# Patient Record
Sex: Male | Born: 1970 | Marital: Married | Smoking: Never smoker
Health system: Southern US, Community
[De-identification: ages and names within clinical notes are randomized; demographics above are authoritative.]

---

## 2016-07-16 HISTORY — PX: EYE SURGERY: SHX253

## 2019-01-20 ENCOUNTER — Emergency Department (HOSPITAL_COMMUNITY)
Admission: EM | Admit: 2019-01-20 | Discharge: 2019-01-20 | Disposition: A | Discharge status: Home / Self Care | Payer: Commercial Managed Care - PPO | Attending: Emergency Medicine | Admitting: Emergency Medicine

## 2019-01-20 ENCOUNTER — Other Ambulatory Visit: Payer: Self-pay

## 2019-01-20 ENCOUNTER — Encounter (HOSPITAL_COMMUNITY): Payer: Self-pay | Admitting: Emergency Medicine

## 2019-01-20 ENCOUNTER — Emergency Department (HOSPITAL_COMMUNITY): Payer: Commercial Managed Care - PPO

## 2019-01-20 DIAGNOSIS — Z8619 Personal history of other infectious and parasitic diseases: Secondary | ICD-10-CM

## 2019-01-20 DIAGNOSIS — J189 Pneumonia, unspecified organism: Secondary | ICD-10-CM

## 2019-01-20 DIAGNOSIS — R05 Cough: Secondary | ICD-10-CM | POA: Diagnosis present

## 2019-01-20 DIAGNOSIS — J181 Lobar pneumonia, unspecified organism: Secondary | ICD-10-CM | POA: Insufficient documentation

## 2019-01-20 DIAGNOSIS — Z8616 Personal history of COVID-19: Secondary | ICD-10-CM

## 2019-01-20 MED ORDER — BENZONATATE 100 MG PO CAPS
100.0000 mg | ORAL_CAPSULE | Freq: Once | ORAL | Status: AC
  Administered 2019-01-20: 100 mg via ORAL
  Filled 2019-01-20: qty 1

## 2019-01-20 MED ORDER — BENZONATATE 100 MG PO CAPS: 100.0000 mg | ORAL_CAPSULE | Freq: Three times a day (TID) | ORAL | 0 refills | Status: DC

## 2019-01-20 MED ORDER — ACETAMINOPHEN 325 MG PO TABS
650.0000 mg | ORAL_TABLET | Freq: Once | ORAL | Status: AC
  Administered 2019-01-20: 650 mg via ORAL
  Filled 2019-01-20: qty 2

## 2019-01-20 MED ORDER — ALBUTEROL SULFATE HFA 108 (90 BASE) MCG/ACT IN AERS
6.0000 | INHALATION_SPRAY | Freq: Once | RESPIRATORY_TRACT | Status: AC
  Administered 2019-01-20: 6 via RESPIRATORY_TRACT
  Filled 2019-01-20: qty 6.7

## 2019-01-20 MED ORDER — DOXYCYCLINE HYCLATE 100 MG PO CAPS: 100.0000 mg | ORAL_CAPSULE | Freq: Two times a day (BID) | ORAL | 0 refills | Status: DC

## 2019-01-20 NOTE — ED Notes (Signed)
Discharge paperwork reviewed with pt, including prescriptions.  Pt verbalized understanding, NAD.  Pt wheeled to ED entrance to await ride, left >6 feet away from other people in lobby and entryway.

## 2019-01-20 NOTE — ED Provider Notes (Signed)
[facility]-EMERGENCY DEPT Provider Note   CSN: [id number] Arrival date & time: 01/20/19  1331    History   Chief Complaint No chief complaint on file.   HPI Randall Gill is a 48 y.o. male.     HPI  Pt is a 48 y/o male who presents to the ED today for eval of cough. States he was diagnosed with COVID 2 weeks ago and since then has had a productive cough, fevers, generalized weakness, sore throat and shortness of breath. States that his shortness of breath is not present at rest but when he tried to move and coughs it makes him sob. Has cp only with cough.  He has had decreased PO intake secondary to his sxs. He had diarrhea 2 days ago that is since resolved. Denies abd pain, nausea or vomiting.  He has been using his daughter's nebulizer treatments at home which she states is been helpful.  History reviewed. No pertinent past medical history.  There are no active problems to display for this patient.   Past Surgical History:  Procedure Laterality Date  . EYE SURGERY  2018   lasix      Home Medications    Prior to Admission medications   Medication Sig Start Date End Date Taking? Authorizing Provider  acetaminophen (TYLENOL) 500 MG tablet Take 1,000 mg by mouth every 6 (six) hours as needed for mild pain.   Yes [provider]  benzonatate (TESSALON) 100 MG capsule Take 1 capsule (100 mg total) by mouth every 8 (eight) hours for 5 days. 01/20/19 01/25/19  Demarius Archila S, PA-C  doxycycline (VIBRAMYCIN) 100 MG capsule Take 1 capsule (100 mg total) by mouth 2 (two) times daily for 7 days. 01/20/19 01/27/19  Viha Kriegel S, PA-C    Family History No family history on file.  Social History Social History   Tobacco Use  . Smoking status: Never Smoker  Substance Use Topics  . Alcohol use: Yes    Comment: socially  . Drug use: Not on file     Allergies   Patient has no known allergies.   Review of Systems Review of Systems   Constitutional: Positive for appetite change, chills, diaphoresis and fever.  HENT: Positive for sore throat. Negative for congestion.   Eyes: Negative for visual disturbance.  Respiratory: Positive for cough and shortness of breath.   Cardiovascular: Positive for chest pain (with cough only).  Gastrointestinal: Positive for diarrhea. Negative for abdominal pain, constipation, nausea and vomiting.  Genitourinary: Negative for dysuria.  Musculoskeletal: Negative for back pain.  Neurological: Positive for headaches.     Physical Exam Updated Vital Signs BP 125/88   Pulse 86   Temp (!) 100.7 F (38.2 C) (Oral)   Resp (!) 28   Ht 5\' 4"  (1.626 m)   Wt 88.5 kg   SpO2 96%   BMI 33.47 kg/m   Physical Exam Vitals signs and nursing note reviewed.  Constitutional:      General: He is not in acute distress.    Appearance: He is well-developed. He is not ill-appearing or toxic-appearing.  HENT:     Head: Normocephalic and atraumatic.  Eyes:     Conjunctiva/sclera: Conjunctivae normal.  Neck:     Musculoskeletal: Neck supple.  Cardiovascular:     Rate and Rhythm: Normal rate and regular rhythm.     Pulses: Normal pulses.     Heart sounds: Normal heart sounds. No murmur.  Pulmonary:     Effort:  Pulmonary effort is normal. No respiratory distress.     Breath sounds: Normal breath sounds. No wheezing, rhonchi or rales.  Abdominal:     General: Bowel sounds are normal.     Palpations: Abdomen is soft.     Tenderness: There is no abdominal tenderness. There is no guarding or rebound.  Musculoskeletal:        General: No tenderness.     Right lower leg: No edema.     Left lower leg: No edema.  Skin:    General: Skin is warm and dry.  Neurological:     Mental Status: He is alert.      ED Treatments / Results  Labs (all labs ordered are listed, but only abnormal results are displayed) Labs Reviewed - No data to display  EKG None  Radiology Dg Chest Portable 1 View   Result Date: 01/20/2019 CLINICAL DATA:  Shortness of breath EXAM: PORTABLE CHEST 1 VIEW COMPARISON:  May 20, 2015 FINDINGS: There is subtle ill-defined opacity in the left lower lobe. Lungs elsewhere are clear. Heart is upper normal in size with pulmonary vascularity normal. No adenopathy. No bone lesions. IMPRESSION: Subtle ill-defined opacity left lower lobe, concerning for early pneumonia. Lungs elsewhere clear. Heart upper normal in size. No evident adenopathy. Electronically Signed   By: Lowella Grip III M.D.   On: 01/20/2019 15:27    Procedures Procedures (including critical care time)  Medications Ordered in ED Medications  acetaminophen (TYLENOL) tablet 650 mg (650 mg Oral Given 01/20/19 1411)  albuterol (VENTOLIN HFA) 108 (90 Base) MCG/ACT inhaler 6 puff (6 puffs Inhalation Given 01/20/19 1505)  benzonatate (TESSALON) capsule 100 mg (100 mg Oral Given 01/20/19 1504)     Initial Impression / Assessment and Plan / ED Course  I have reviewed the triage vital signs and the nursing notes.  Pertinent labs & imaging results that were available during my care of the patient were reviewed by me and considered in my medical decision making (see chart for details).   Final Clinical Impressions(s) / ED Diagnoses   Final diagnoses:  Pneumonia of left lower lobe due to infectious organism [facility])  History of 2019 novel coronavirus disease (COVID-5)   48 year old male presenting for evaluation of cough.  Recently diagnosed with coronavirus 2 weeks ago.  Has had some dyspnea when talking and ambulating but not at rest.  No chest pain.  Has had continued fevers.  On arrival he is febrile, mildly tachypneic, but otherwise his vital signs are reassuring.  He is satting well on room air.  He is in no acute distress on exam.  Nontoxic nonseptic appearing.  Lungs are clear to auscultation bilaterally.  No fluid overload on exam.  Heart with regular rate and rhythm.    Chest x-ray shows a left  lower lobe pneumonia.  He was given p.o. fluid hydration, albuterol, cough medication and Tylenol.  On reassessment he states that he feels improved.  He continues to have normal oxygen saturation.  I discussed the results of his chest x-ray showing left lower lobe pneumonia.  I will give Rx for doxycycline, cough medication and advised him to take the albuterol.  I advised to continue self quarantining given he still may have active covert infection.  Advise close follow-up with PCP and return to the ER for new or worsening symptoms.  He voices understanding of the plan and reasons to return to the ED.  All questions answered.  Patient stable for discharge.  [employer]  was evaluated in Emergency Department on 01/20/2019 for the symptoms described in the history of present illness. He was evaluated in the context of the global COVID-19 pandemic, which necessitated consideration that the patient might be at risk for infection with the SARS-CoV-2 virus that causes COVID-19. Institutional protocols and algorithms that pertain to the evaluation of patients at risk for COVID-19 are in a state of rapid change based on information released by regulatory bodies including the CDC and federal and state organizations. These policies and algorithms were followed during the patient's care in the ED.   ED Discharge Orders         Ordered    doxycycline (VIBRAMYCIN) 100 MG capsule  2 times daily     01/20/19 1549    benzonatate (TESSALON) 100 MG capsule  Every 8 hours     01/20/19 1549           Nur Krasinski S, PA-C 01/20/19 1731    Gerhard MunchLockwood, Robert, MD 01/21/19 [phone]

## 2019-01-20 NOTE — Discharge Instructions (Signed)
You were given an antibiotic to help treat your pneumonia.  You were also given cough medication for your symptoms.  Please take 2 puffs of the albuterol inhaler every 4-6 hours as needed for cough and shortness of breath.  Please make an appointment to follow-up with your regular doctor next 5 to 7 days for reevaluation.  If you have any new or worsening symptoms in the meantime including worsening shortness of breath, chest pain or persistent fevers then return to the emergency department immediately.

## 2019-01-20 NOTE — ED Notes (Signed)
X-ray at bedside

## 2019-01-20 NOTE — ED Triage Notes (Signed)
Pt BIBA from home, reports testing positive for Covid 2 weeks.  For last 2 weeks, pt c/o gen weakness, SHOB, fever.  C/o CP when coughing.  Pt sats 97% RA.

## 2019-01-24 ENCOUNTER — Inpatient Hospital Stay (HOSPITAL_COMMUNITY): Payer: Commercial Managed Care - PPO

## 2019-01-24 ENCOUNTER — Inpatient Hospital Stay (HOSPITAL_COMMUNITY)
Admission: AD | Admit: 2019-01-24 | Discharge: 2019-01-29 | DRG: 177 | Disposition: A | Discharge status: Home / Self Care | Payer: Commercial Managed Care - PPO | Source: Other Acute Inpatient Hospital | Attending: Internal Medicine | Admitting: Internal Medicine

## 2019-01-24 ENCOUNTER — Emergency Department
Admission: EM | Admit: 2019-01-24 | Discharge: 2019-01-24 | Discharge status: Absent without leave | Payer: Commercial Managed Care - PPO

## 2019-01-24 ENCOUNTER — Other Ambulatory Visit: Payer: Self-pay

## 2019-01-24 ENCOUNTER — Encounter (HOSPITAL_COMMUNITY): Payer: Self-pay

## 2019-01-24 DIAGNOSIS — M1990 Unspecified osteoarthritis, unspecified site: Secondary | ICD-10-CM | POA: Diagnosis present

## 2019-01-24 DIAGNOSIS — Z9981 Dependence on supplemental oxygen: Secondary | ICD-10-CM | POA: Diagnosis not present

## 2019-01-24 DIAGNOSIS — E669 Obesity, unspecified: Secondary | ICD-10-CM | POA: Diagnosis present

## 2019-01-24 DIAGNOSIS — R001 Bradycardia, unspecified: Secondary | ICD-10-CM | POA: Diagnosis present

## 2019-01-24 DIAGNOSIS — R0602 Shortness of breath: Secondary | ICD-10-CM

## 2019-01-24 DIAGNOSIS — J1289 Other viral pneumonia: Secondary | ICD-10-CM | POA: Diagnosis present

## 2019-01-24 DIAGNOSIS — Z6829 Body mass index (BMI) 29.0-29.9, adult: Secondary | ICD-10-CM

## 2019-01-24 DIAGNOSIS — I10 Essential (primary) hypertension: Secondary | ICD-10-CM | POA: Diagnosis present

## 2019-01-24 DIAGNOSIS — J1282 Pneumonia due to coronavirus disease 2019: Secondary | ICD-10-CM | POA: Diagnosis present

## 2019-01-24 DIAGNOSIS — E785 Hyperlipidemia, unspecified: Secondary | ICD-10-CM | POA: Diagnosis present

## 2019-01-24 DIAGNOSIS — U071 COVID-19: Principal | ICD-10-CM | POA: Diagnosis present

## 2019-01-24 DIAGNOSIS — M150 Primary generalized (osteo)arthritis: Secondary | ICD-10-CM | POA: Diagnosis not present

## 2019-01-24 DIAGNOSIS — E7800 Pure hypercholesterolemia, unspecified: Secondary | ICD-10-CM | POA: Diagnosis not present

## 2019-01-24 DIAGNOSIS — R9431 Abnormal electrocardiogram [ECG] [EKG]: Secondary | ICD-10-CM | POA: Diagnosis present

## 2019-01-24 DIAGNOSIS — G4733 Obstructive sleep apnea (adult) (pediatric): Secondary | ICD-10-CM | POA: Diagnosis present

## 2019-01-24 LAB — COMPREHENSIVE METABOLIC PANEL
ALT: 55 U/L — ABNORMAL HIGH (ref 0–44)
AST: 43 U/L — ABNORMAL HIGH (ref 15–41)
Albumin: 3.4 g/dL — ABNORMAL LOW (ref 3.5–5.0)
Alkaline Phosphatase: 53 U/L (ref 38–126)
Anion gap: 13 (ref 5–15)
BUN: 20 mg/dL (ref 6–20)
CO2: 25 mmol/L (ref 22–32)
Calcium: 9 mg/dL (ref 8.9–10.3)
Chloride: 100 mmol/L (ref 98–111)
Creatinine, Ser: 0.66 mg/dL (ref 0.61–1.24)
GFR calc Af Amer: >60 mL/min (ref >60)
GFR calc non Af Amer: >60 mL/min (ref >60)
Glucose, Bld: 131 mg/dL — ABNORMAL HIGH (ref 70–99)
Potassium: 4.1 mmol/L (ref 3.5–5.1)
Sodium: 138 mmol/L (ref 135–145)
Total Bilirubin: 0.8 mg/dL (ref 0.3–1.2)
Total Protein: 7.4 g/dL (ref 6.5–8.1)

## 2019-01-24 LAB — CBC WITH DIFFERENTIAL/PLATELET
Abs Immature Granulocytes: 0.13 10*3/uL — ABNORMAL HIGH (ref 0.00–0.07)
Basophils Absolute: 0 10*3/uL (ref 0.0–0.1)
Basophils Relative: 0 %
Eosinophils Absolute: 0 10*3/uL (ref 0.0–0.5)
Eosinophils Relative: 0 %
HCT: 46.2 % (ref 39.0–52.0)
Hemoglobin: 15.9 g/dL (ref 13.0–17.0)
Immature Granulocytes: 2 %
Lymphocytes Relative: 14 %
Lymphs Abs: 1 10*3/uL (ref 0.7–4.0)
MCH: 29.1 pg (ref 26.0–34.0)
MCHC: 34.4 g/dL (ref 30.0–36.0)
MCV: 84.5 fL (ref 80.0–100.0)
Monocytes Absolute: 0.3 10*3/uL (ref 0.1–1.0)
Monocytes Relative: 4 %
Neutro Abs: 6.2 10*3/uL (ref 1.7–7.7)
Neutrophils Relative %: 80 %
Platelets: 310 10*3/uL (ref 150–400)
RBC: 5.47 MIL/uL (ref 4.22–5.81)
RDW: 11.8 % (ref 11.5–15.5)
WBC: 7.7 10*3/uL (ref 4.0–10.5)
nRBC: 0 % (ref 0.0–0.2)

## 2019-01-24 LAB — GLUCOSE, CAPILLARY: Glucose-Capillary: 205 mg/dL — ABNORMAL HIGH (ref 70–99)

## 2019-01-24 LAB — TYPE AND SCREEN
ABO/RH(D): O POS
Antibody Screen: NEGATIVE

## 2019-01-24 LAB — C-REACTIVE PROTEIN: CRP: 9 mg/dL — ABNORMAL HIGH (ref <1.0)

## 2019-01-24 LAB — TROPONIN I (HIGH SENSITIVITY): Troponin I (High Sensitivity): 2 ng/L (ref <18)

## 2019-01-24 LAB — D-DIMER, QUANTITATIVE: D-Dimer, Quant: 0.38 ug/mL-FEU (ref 0.00–0.50)

## 2019-01-24 LAB — HEMOGLOBIN A1C
Hgb A1c MFr Bld: 5.6 % (ref 4.8–5.6)
Mean Plasma Glucose: 114.02 mg/dL

## 2019-01-24 LAB — BRAIN NATRIURETIC PEPTIDE: B Natriuretic Peptide: 28.8 pg/mL (ref 0.0–100.0)

## 2019-01-24 LAB — FIBRINOGEN: Fibrinogen: >800 mg/dL — ABNORMAL HIGH (ref 210–475)

## 2019-01-24 LAB — PROCALCITONIN: Procalcitonin: <0.1 ng/mL

## 2019-01-24 LAB — SEDIMENTATION RATE: Sed Rate: 59 mm/hr — ABNORMAL HIGH (ref 0–16)

## 2019-01-24 LAB — LACTATE DEHYDROGENASE: LDH: 269 U/L — ABNORMAL HIGH (ref 98–192)

## 2019-01-24 LAB — MAGNESIUM: Magnesium: 2.5 mg/dL — ABNORMAL HIGH (ref 1.7–2.4)

## 2019-01-24 LAB — FERRITIN: Ferritin: 375 ng/mL — ABNORMAL HIGH (ref 24–336)

## 2019-01-24 MED ORDER — ZOLPIDEM TARTRATE 5 MG PO TABS
10.0000 mg | ORAL_TABLET | Freq: Every evening | ORAL | Status: DC | PRN
  Administered 2019-01-25: 10 mg via ORAL
  Filled 2019-01-24: qty 2

## 2019-01-24 MED ORDER — SODIUM CHLORIDE 0.9 % IV SOLN
100.0000 mg | INTRAVENOUS | Status: AC
  Administered 2019-01-25 – 2019-01-28 (×4): 100 mg via INTRAVENOUS
  Filled 2019-01-24 (×4): qty 20

## 2019-01-24 MED ORDER — HYDROCOD POLST-CPM POLST ER 10-8 MG/5ML PO SUER
5.0000 mL | Freq: Two times a day (BID) | ORAL | Status: DC | PRN
  Administered 2019-01-25 – 2019-01-26 (×3): 5 mL via ORAL
  Filled 2019-01-24 (×3): qty 5

## 2019-01-24 MED ORDER — OXYCODONE HCL 5 MG PO TABS: 5.0000 mg | ORAL_TABLET | ORAL | Status: DC | PRN

## 2019-01-24 MED ORDER — ENOXAPARIN SODIUM 40 MG/0.4ML ~~LOC~~ SOLN
40.0000 mg | SUBCUTANEOUS | Status: DC
  Administered 2019-01-24 – 2019-01-28 (×5): 40 mg via SUBCUTANEOUS
  Filled 2019-01-24 (×5): qty 0.4

## 2019-01-24 MED ORDER — INSULIN ASPART 100 UNIT/ML ~~LOC~~ SOLN
0.0000 [IU] | SUBCUTANEOUS | Status: DC
  Administered 2019-01-24: 2 [IU] via SUBCUTANEOUS
  Administered 2019-01-24: 5 [IU] via SUBCUTANEOUS
  Administered 2019-01-25: 3 [IU] via SUBCUTANEOUS
  Administered 2019-01-25: 2 [IU] via SUBCUTANEOUS
  Administered 2019-01-25: 3 [IU] via SUBCUTANEOUS
  Administered 2019-01-25: 2 [IU] via SUBCUTANEOUS
  Administered 2019-01-25 – 2019-01-26 (×3): 3 [IU] via SUBCUTANEOUS
  Administered 2019-01-26 (×2): 2 [IU] via SUBCUTANEOUS
  Administered 2019-01-26: 3 [IU] via SUBCUTANEOUS
  Administered 2019-01-26: 5 [IU] via SUBCUTANEOUS
  Administered 2019-01-26: 3 [IU] via SUBCUTANEOUS
  Administered 2019-01-26 – 2019-01-27 (×2): 2 [IU] via SUBCUTANEOUS
  Administered 2019-01-27: 5 [IU] via SUBCUTANEOUS
  Administered 2019-01-27 – 2019-01-28 (×3): 3 [IU] via SUBCUTANEOUS
  Administered 2019-01-28: 2 [IU] via SUBCUTANEOUS

## 2019-01-24 MED ORDER — SODIUM CHLORIDE 0.9 % IV SOLN
INTRAVENOUS | Status: DC
  Administered 2019-01-24 – 2019-01-27 (×5): via INTRAVENOUS

## 2019-01-24 MED ORDER — VITAMIN B-1 100 MG PO TABS
100.0000 mg | ORAL_TABLET | Freq: Every day | ORAL | Status: DC
  Administered 2019-01-24 – 2019-01-29 (×6): 100 mg via ORAL
  Filled 2019-01-24 (×6): qty 1

## 2019-01-24 MED ORDER — METHYLPREDNISOLONE SODIUM SUCC 40 MG IJ SOLR
40.0000 mg | Freq: Three times a day (TID) | INTRAMUSCULAR | Status: DC
  Administered 2019-01-24 – 2019-01-27 (×11): 40 mg via INTRAVENOUS
  Filled 2019-01-24 (×11): qty 1

## 2019-01-24 MED ORDER — SODIUM CHLORIDE 0.9 % IV SOLN
200.0000 mg | Freq: Once | INTRAVENOUS | Status: AC
  Administered 2019-01-24: 200 mg via INTRAVENOUS
  Filled 2019-01-24: qty 40

## 2019-01-24 MED ORDER — FOLIC ACID 1 MG PO TABS
1.0000 mg | ORAL_TABLET | Freq: Every day | ORAL | Status: DC
  Administered 2019-01-24 – 2019-01-29 (×6): 1 mg via ORAL
  Filled 2019-01-24 (×6): qty 1

## 2019-01-24 MED ORDER — IPRATROPIUM-ALBUTEROL 20-100 MCG/ACT IN AERS
1.0000 | INHALATION_SPRAY | Freq: Four times a day (QID) | RESPIRATORY_TRACT | Status: DC
  Administered 2019-01-24 – 2019-01-29 (×18): 1 via RESPIRATORY_TRACT
  Filled 2019-01-24: qty 4

## 2019-01-24 MED ORDER — ASPIRIN EC 81 MG PO TBEC
81.0000 mg | DELAYED_RELEASE_TABLET | Freq: Every day | ORAL | Status: DC
  Administered 2019-01-24 – 2019-01-29 (×6): 81 mg via ORAL
  Filled 2019-01-24 (×6): qty 1

## 2019-01-24 MED ORDER — SENNOSIDES-DOCUSATE SODIUM 8.6-50 MG PO TABS: 1.0000 | ORAL_TABLET | Freq: Every evening | ORAL | Status: DC | PRN

## 2019-01-24 MED ORDER — VITAMIN C 500 MG PO TABS
500.0000 mg | ORAL_TABLET | Freq: Every day | ORAL | Status: DC
  Administered 2019-01-24 – 2019-01-29 (×6): 500 mg via ORAL
  Filled 2019-01-24 (×6): qty 1

## 2019-01-24 MED ORDER — ZINC SULFATE 220 (50 ZN) MG PO CAPS
220.0000 mg | ORAL_CAPSULE | Freq: Every day | ORAL | Status: DC
  Administered 2019-01-24 – 2019-01-29 (×6): 220 mg via ORAL
  Filled 2019-01-24 (×5): qty 1

## 2019-01-24 MED ORDER — GUAIFENESIN-DM 100-10 MG/5ML PO SYRP
10.0000 mL | ORAL_SOLUTION | ORAL | Status: DC | PRN
  Administered 2019-01-24 – 2019-01-28 (×8): 10 mL via ORAL
  Filled 2019-01-24 (×8): qty 10

## 2019-01-24 MED ORDER — ACETAMINOPHEN 325 MG PO TABS: 650.0000 mg | ORAL_TABLET | Freq: Four times a day (QID) | ORAL | Status: DC | PRN

## 2019-01-24 NOTE — Progress Notes (Signed)
Dr. Sherral Hammers messaged of pt's admission EKG.

## 2019-01-24 NOTE — Progress Notes (Signed)
Pharmacy Brief Note  O:  ALT: 55 CXR: "patchy infiltrates throughout both lungs" SpO2: 91 % on 3 L [place]  A/P:  Patient meets criteria for remdesevir therapy. Will initiate remdesivir 200 mg iv once followed by 100 mg iv daily x 4 days. Will f/u ALT.   Napoleon Form, [facility] 01/24/19 4:33 PM

## 2019-01-24 NOTE — H&P (Signed)
[facility] History and Physical  Randall KinnierJuan Gill JYN:[id number] DOB: 09/22/1970 DOA: 01/24/2019  Referring physician:  PCP: Randall CherryWhyte, [name], MD   Chief Complaint: Randall FlowS OB  HPI: Randall Gill is a 48 y.o. HM  PMHx HTN, bradycardia, HLD, OSA obese, OA,   [facility] complaining of being diagnosed with COVID 2 weeks ago and since then has had a productive cough, fevers, generalized weakness, sore throat and shortness of breath. States that his shortness of breath is not present at rest but when he tried to move and coughs it makes him sob. Has cp only with cough.  He has had decreased PO intake secondary to his sxs. He had diarrhea 2 days ago that is since resolved diagnosed with left lower lobe pneumonia and possibly active COVID infection.  Treated with doxycycline advised to self quarantine and if infection worsened return to ED    Review of Systems:  Constitutional:  No weight loss, night sweats, positive fevers, chills, fatigue.  HEENT:  No headaches, Difficulty swallowing,Tooth/dental problems,Sore throat,  No sneezing, itching, ear ache, nasal congestion, post nasal drip,  Cardio-vascular:  No chest pain, Orthopnea, PND, swelling in lower extremities, anasarca, dizziness, palpitations  GI:  No heartburn, indigestion, abdominal pain, nausea, vomiting, diarrhea, change in bowel habits, loss of appetite  Resp:  positive shortness of breath with exertion or at rest. No excess mucus, no productive cough,  positive non-productive cough, No coughing up of blood.No change in color of mucus.No wheezing.No chest wall deformity  Skin:  no rash or lesions.  GU:  no dysuria, change in color of urine, no urgency or frequency. No flank pain.  Musculoskeletal:  No joint pain or swelling. No decreased range of motion. No back pain.  Psych:  No change in mood or affect. No depression or anxiety. No memory loss.   No past medical history on file. Past Surgical History:   Procedure Laterality Date  . EYE SURGERY  2018   lasix   Social History:  reports that he has never smoked. He does not have any smokeless tobacco history on file. He reports current alcohol use. No history on file for drug.  No Known Allergies  No family history on file.  Daughter COVID positive  Prior to Admission medications   Medication Sig Start Date End Date Taking? Authorizing Provider  acetaminophen (TYLENOL) 500 MG tablet Take 1,000 mg by mouth every 6 (six) hours as needed for mild pain.    [provider]  benzonatate (TESSALON) 100 MG capsule Take 1 capsule (100 mg total) by mouth every 8 (eight) hours for 5 days. 01/20/19 01/25/19  Couture, Cortni S, PA-C  doxycycline (VIBRAMYCIN) 100 MG capsule Take 1 capsule (100 mg total) by mouth 2 (two) times daily for 7 days. 01/20/19 01/27/19  Couture, Cortni S, PA-C     Consultants:    Procedures/Significant Events:  7/7 PCXR:Subtle ill-defined opacity left lower lobe, concerning for early pneumonia.   ---------------------------------------------------------------------------------------------------------------- 7/11 PCXR: bilateral patchy opacifications consistent with viral pneumonia most likely COVID.      I have personally reviewed and interpreted all radiology studies and my findings are as above.   VENTILATOR SETTINGS:    [facility]   Antimicrobials: Anti-infectives (From admission, onward)   Start     Stop   01/25/19 1730  remdesivir 100 mg in sodium chloride 0.9 % 250 mL IVPB     01/29/19 1729   01/24/19 1730  remdesivir 200 mg in sodium chloride 0.9 % 250 mL IVPB             Devices    LINES / TUBES:      Continuous Infusions:  Physical Exam: Vitals:   01/24/19 1300  BP: (!) 124/95  Pulse: 68  Resp: (!) 23  Temp: 98.2 F (36.8 C)  SpO2: 92%    Wt Readings from Last 3 Encounters:  01/20/19 88.5 kg    General: A/O x4, positive   acute respiratory distress (2 L O2 via [place]: SPO2 93%) Eyes: negative scleral hemorrhage, negative anisocoria, negative icterus ENT: Negative Runny nose, negative gingival bleeding, Neck:  Negative scars, masses, torticollis, lymphadenopathy, JVD Lungs: Tachypneic, positive diffuse crackles, negative wheeze, positive cough with deep inspiration Cardiovascular: Tachycardic, negative gallop or rub normal S1 and S2 Abdomen: negative abdominal pain, nondistended, positive soft, bowel sounds, no rebound, no ascites, no appreciable mass Extremities: No significant cyanosis, clubbing, or edema bilateral lower extremities Skin: Negative rashes, lesions, ulcers Psychiatric:  Negative depression, negative anxiety, negative fatigue, negative mania  Central nervous system:  Cranial nerves II through XII intact, tongue/uvula midline, all extremities muscle strength 5/5, sensation intact throughout,  negative dysarthria, negative expressive aphasia, negative receptive aphasia.        Labs on Admission:  Basic Metabolic Panel: No results for input(s): NA, K, CL, CO2, GLUCOSE, BUN, CREATININE, CALCIUM, MG, PHOS in the last 168 hours. Liver Function Tests: No results for input(s): AST, ALT, ALKPHOS, BILITOT, PROT, ALBUMIN in the last 168 hours. No results for input(s): LIPASE, AMYLASE in the last 168 hours. No results for input(s): AMMONIA in the last 168 hours. CBC: No results for input(s): WBC, NEUTROABS, HGB, HCT, MCV, PLT in the last 168 hours. Cardiac Enzymes: No results for input(s): CKTOTAL, CKMB, CKMBINDEX, TROPONINI in the last 168 hours.  BNP (last 3 results) No results for input(s): BNP in the last 8760 hours.  ProBNP (last 3 results) No results for input(s): PROBNP in the last 8760 hours.  CBG: No results for input(s): GLUCAP in the last 168 hours.  Radiological Exams on Admission: No results found.  EKG: Independently reviewed.  NSR, left axis deviation, inferior infarct age  undetermined.  Assessment/Plan Active Problems:   Pneumonia due to COVID-19 virus   Essential hypertension   Bradycardia   HLD (hyperlipidemia)   OSA (obstructive sleep apnea)   Obese   OA (osteoarthritis)  COVID pneumonia - Patient does not use home O2. - Currently on 2 L O2 via [place] to maintain SPO2 93%.  Previous PCXR on 7/11 PCXR shows bilateral patchy opacifications consistent with viral pneumonia most likely COVID. - Solu-Medrol 40 mg 3 times daily - Patient meets guidelines for Remdesivir per pharmacy guideline - Also counseled patient dependent upon the COVID inflammatory markers may need to start Actemra.  Patient verifies no previous history of liver disease, or TB. - Daily COVID inflammatory markers pending -Titrate O2 to maintain SPO2> 93% - If required prone patient 2 to 3 hours every 12 hours in order to help maintain SPO2 goal. - OSA  -Titrate O2 to maintain SPO2> 93%.  At night if required use HF [place] and lieu of CPAP  Essential HTN -Currently BP controlled without medication.  Will monitor call from  Bradycardia - Currently not an issue  Inferior MI on EKG - Negative cardiac CP. - Trend troponins -Troponins positive will obtain echocardiogram and consult cardiology.  OA    Code Status: Full (DVT Prophylaxis: Lovenox Family  Communication: None Disposition Plan: TBD    Data Reviewed: Care during the described time interval was provided by me .  I have reviewed this patient's available data, including medical history, events of note, physical examination, and all test results as part of my evaluation.   The patient is critically ill with multiple organ systems failure and requires high complexity decision making for assessment and support, frequent evaluation and titration of therapies, application of advanced monitoring technologies and extensive interpretation of multiple databases. Critical Care Time devoted to patient care services described in this note   Time spent: 5 minutes   Saraya Tirey, [facility] Pager [phone]

## 2019-01-24 NOTE — Progress Notes (Signed)
Patient states he updated his wife on condition today and did not need me to call her at this time.  [name] RN

## 2019-01-25 DIAGNOSIS — R0602 Shortness of breath: Secondary | ICD-10-CM

## 2019-01-25 LAB — CBC WITH DIFFERENTIAL/PLATELET
Abs Immature Granulocytes: 0.14 10*3/uL — ABNORMAL HIGH (ref 0.00–0.07)
Basophils Absolute: 0 10*3/uL (ref 0.0–0.1)
Basophils Relative: 0 %
Eosinophils Absolute: 0 10*3/uL (ref 0.0–0.5)
Eosinophils Relative: 0 %
HCT: 42.7 % (ref 39.0–52.0)
Hemoglobin: 14.2 g/dL (ref 13.0–17.0)
Immature Granulocytes: 1 %
Lymphocytes Relative: 11 %
Lymphs Abs: 1.2 10*3/uL (ref 0.7–4.0)
MCH: 28.4 pg (ref 26.0–34.0)
MCHC: 33.3 g/dL (ref 30.0–36.0)
MCV: 85.4 fL (ref 80.0–100.0)
Monocytes Absolute: 0.6 10*3/uL (ref 0.1–1.0)
Monocytes Relative: 6 %
Neutro Abs: 9 10*3/uL — ABNORMAL HIGH (ref 1.7–7.7)
Neutrophils Relative %: 82 %
Platelets: 323 10*3/uL (ref 150–400)
RBC: 5 MIL/uL (ref 4.22–5.81)
RDW: 11.8 % (ref 11.5–15.5)
WBC: 10.9 10*3/uL — ABNORMAL HIGH (ref 4.0–10.5)
nRBC: 0 % (ref 0.0–0.2)

## 2019-01-25 LAB — COMPREHENSIVE METABOLIC PANEL
ALT: 100 U/L — ABNORMAL HIGH (ref 0–44)
AST: 59 U/L — ABNORMAL HIGH (ref 15–41)
Albumin: 3 g/dL — ABNORMAL LOW (ref 3.5–5.0)
Alkaline Phosphatase: 50 U/L (ref 38–126)
Anion gap: 9 (ref 5–15)
BUN: 25 mg/dL — ABNORMAL HIGH (ref 6–20)
CO2: 25 mmol/L (ref 22–32)
Calcium: 8.3 mg/dL — ABNORMAL LOW (ref 8.9–10.3)
Chloride: 102 mmol/L (ref 98–111)
Creatinine, Ser: 0.67 mg/dL (ref 0.61–1.24)
GFR calc Af Amer: >60 mL/min (ref >60)
GFR calc non Af Amer: >60 mL/min (ref >60)
Glucose, Bld: 124 mg/dL — ABNORMAL HIGH (ref 70–99)
Potassium: 4 mmol/L (ref 3.5–5.1)
Sodium: 136 mmol/L (ref 135–145)
Total Bilirubin: 0.5 mg/dL (ref 0.3–1.2)
Total Protein: 6.3 g/dL — ABNORMAL LOW (ref 6.5–8.1)

## 2019-01-25 LAB — ABO/RH: ABO/RH(D): O POS

## 2019-01-25 LAB — GLUCOSE, CAPILLARY
Glucose-Capillary: 122 mg/dL — ABNORMAL HIGH (ref 70–99)
Glucose-Capillary: 132 mg/dL — ABNORMAL HIGH (ref 70–99)
Glucose-Capillary: 144 mg/dL — ABNORMAL HIGH (ref 70–99)
Glucose-Capillary: 155 mg/dL — ABNORMAL HIGH (ref 70–99)
Glucose-Capillary: 169 mg/dL — ABNORMAL HIGH (ref 70–99)
Glucose-Capillary: 180 mg/dL — ABNORMAL HIGH (ref 70–99)

## 2019-01-25 LAB — HIV ANTIBODY (ROUTINE TESTING W REFLEX): HIV Screen 4th Generation wRfx: NONREACTIVE

## 2019-01-25 LAB — D-DIMER, QUANTITATIVE: D-Dimer, Quant: 0.35 ug/mL-FEU (ref 0.00–0.50)

## 2019-01-25 LAB — FERRITIN: Ferritin: 452 ng/mL — ABNORMAL HIGH (ref 24–336)

## 2019-01-25 LAB — PHOSPHORUS: Phosphorus: 3.5 mg/dL (ref 2.5–4.6)

## 2019-01-25 LAB — C-REACTIVE PROTEIN: CRP: 4.9 mg/dL — ABNORMAL HIGH (ref <1.0)

## 2019-01-25 LAB — MAGNESIUM: Magnesium: 2.3 mg/dL (ref 1.7–2.4)

## 2019-01-25 LAB — SARS CORONAVIRUS 2 BY RT PCR (HOSPITAL ORDER, PERFORMED IN ~~LOC~~ HOSPITAL LAB): SARS Coronavirus 2: POSITIVE — AB

## 2019-01-25 LAB — CK: Total CK: 81 U/L (ref 49–397)

## 2019-01-25 NOTE — Progress Notes (Signed)
PROGRESS NOTE    Randall KinnierJuan Sarris  ZOX:[id number] DOB: 08-Nov-1970 DOA: 01/24/2019 PCP: Everlean CherryWhyte, [name], MD   Brief Narrative:  48 y.o. HM  PMHx HTN, bradycardia, HLD, OSA obese, OA,   [facility] complaining of being diagnosed with COVID 2 weeks ago and since then has had a productive cough, fevers, generalized weakness, sore throat and shortness of breath. States that his shortness of breath is not present at rest but when he tried to move and coughs it makes him sob. Has cp only with cough. He has had decreased PO intake secondary to his sxs. He had diarrhea 2 days ago that is since resolved diagnosed with left lower lobe pneumonia and possibly active COVID infection.  Treated with doxycycline advised to self quarantine and if infection worsened return to ED  Subjective: A/O x4, negative CP, negative S OB, negative abdominal pain sitting in chair comfortably.   Assessment & Plan:   Active Problems:   Pneumonia due to COVID-19 virus   Essential hypertension   Bradycardia   HLD (hyperlipidemia)   OSA (obstructive sleep apnea)   Obese   OA (osteoarthritis)   COVID pneumonia - Patient does not use home O2. - Currently on 2 L O2 via [place] to maintain SPO2 93%.  Previous PCXR on 7/11 PCXR shows bilateral patchy opacifications consistent with viral pneumonia most likely COVID. - Solu-Medrol 40 mg 3 times daily - Patient meets guidelines for Remdesivir per pharmacy guideline - Also counseled patient dependent upon the COVID inflammatory markers may need to start Actemra.  Patient verifies no previous history of liver disease, or TB. - Daily COVID inflammatory markers pending -Titrate O2 to maintain SPO2> 93% - If required prone patient 2 to 3 hours every 12 hours in order to help maintain SPO2 goal. Recent Labs  Lab 01/24/19 1508 01/25/19 0210  CRP 9.0* 4.9*    OSA  -Titrate O2 to maintain SPO2> 93%.  At night if required use HF [place] and lieu of CPAP   Essential HTN -Currently BP controlled without medication.  Will monitor call from  Bradycardia -  Patient has had episodes of bradycardia/pauses overnight asymptomatic.  Amp of atropine at bedside, pacer at bedside in case patient has symptoms again.  Inferior MI on EKG - Negative cardiac CP. - Troponin high-sensitivity negative   OA    DVT prophylaxis: Lovenox Code Status: Full Family Communication: None Disposition Plan: TBD   Consultants:  None  Procedures/Significant Events:  7/7 PCXR:Subtle ill-defined opacity left lower lobe, concerning for early pneumonia.   ---------------------------------------------------------------------------------------------------------------- 7/11 PCXR: bilateral patchy opacifications consistent with viral pneumonia most likely COVID.    I have personally reviewed and interpreted all radiology studies and my findings are as above.  VENTILATOR SETTINGS:   Cultures 7/10 coronavirus [facility]  *  Antimicrobials: Anti-infectives (From admission, onward)   Start     Stop   01/25/19 1730  remdesivir 100 mg in sodium chloride 0.9 % 250 mL IVPB     01/29/19 1729   01/24/19 1730  remdesivir 200 mg in sodium chloride 0.9 % 250 mL IVPB     01/24/19 1830       Devices    LINES / TUBES:      Continuous Infusions: . sodium chloride 75 mL/hr at 01/24/19 1756  . remdesivir 100 mg in NS 250 mL       Objective: Vitals:   01/25/19 0500 01/25/19 0600 01/25/19 0629 01/25/19 0800  BP:   122/79 114/67  Pulse: (!) 45 (!) 44 (!) 52 (!) 46  Resp: (!) 25 20 (!) 30 (!) 22  Temp:      TempSrc:      SpO2: 95% 93% 92% 95%  Weight:      Height:        Intake/Output Summary (Last 24 hours) at 01/25/2019 1028 Last data filed at 01/25/2019 0600 Gross per 24 hour  Intake 1766.06 ml  Output 575 ml  Net 1191.06 ml   Filed Weights   01/24/19 1728 01/24/19 1748  Weight: 77.5 kg 77.5 kg    Examination:   General: A/O x4, positive acute respiratory distress (3 L O2 on [place]: SPO2 93%) Eyes: negative scleral hemorrhage, negative anisocoria, negative icterus ENT: Negative Runny nose, negative gingival bleeding, Neck:  Negative scars, masses, torticollis, lymphadenopathy, JVD Lungs: Clear to auscultation bilaterally without wheezes or crackles Cardiovascular: Regular rate and rhythm without murmur gallop or rub normal S1 and S2 Abdomen: negative abdominal pain, nondistended, positive soft, bowel sounds, no rebound, no ascites, no appreciable mass Extremities: No significant cyanosis, clubbing, or edema bilateral lower extremities Skin: Negative rashes, lesions, ulcers Psychiatric:  Negative depression, negative anxiety, negative fatigue, negative mania  Central nervous system:  Cranial nerves II through XII intact, tongue/uvula midline, all extremities muscle strength 5/5, sensation intact throughout,  negative dysarthria, negative expressive aphasia, negative receptive aphasia.  .     Data Reviewed: Care during the described time interval was provided by me .  I have reviewed this patient's available data, including medical history, events of note, physical examination, and all test results as part of my evaluation.   CBC: Recent Labs  Lab 01/24/19 1508 01/25/19 0210  WBC 7.7 10.9*  NEUTROABS 6.2 9.0*  HGB 15.9 14.2  HCT 46.2 42.7  MCV 84.5 85.4  PLT 310 323   Basic Metabolic Panel: Recent Labs  Lab 01/24/19 1508 01/25/19 0210  NA 138  --   K 4.1  --   CL 100  --   CO2 25  --   GLUCOSE 131*  --   BUN 20  --   CREATININE 0.66  --   CALCIUM 9.0  --   MG 2.5* 2.3   GFR: Estimated Creatinine Clearance: 107.4 mL/min (by C-G formula based on SCr of 0.66 mg/dL). Liver Function Tests: Recent Labs  Lab 01/24/19 1508  AST 43*  ALT 55*  ALKPHOS 53  BILITOT 0.8  PROT 7.4  ALBUMIN 3.4*   No results for input(s): LIPASE, AMYLASE in the last 168 hours. No results for input(s):  AMMONIA in the last 168 hours. Coagulation Profile: No results for input(s): INR, PROTIME in the last 168 hours. Cardiac Enzymes: No results for input(s): CKTOTAL, CKMB, CKMBINDEX, TROPONINI in the last 168 hours. BNP (last 3 results) No results for input(s): PROBNP in the last 8760 hours. HbA1C: Recent Labs    01/24/19 1508  HGBA1C 5.6   CBG: Recent Labs  Lab 01/24/19 2038 01/25/19 0000 01/25/19 0426 01/25/19 0810  GLUCAP 205* 155* 132* 122*   Lipid Profile: No results for input(s): CHOL, HDL, LDLCALC, TRIG, CHOLHDL, LDLDIRECT in the last 72 hours. Thyroid Function Tests: No results for input(s): TSH, T4TOTAL, FREET4, T3FREE, THYROIDAB in the last 72 hours. Anemia Panel: Recent Labs    01/24/19 1508 01/25/19 0210  FERRITIN 375* 452*   Urine analysis: No results found for: COLORURINE, APPEARANCEUR, LABSPEC, PHURINE, GLUCOSEU, HGBUR, BILIRUBINUR, KETONESUR, PROTEINUR, UROBILINOGEN, NITRITE, LEUKOCYTESUR Sepsis Labs: @LABRCNTIP (procalcitonin:4,lacticidven:4)  )  No results found for this or any previous visit (from the past 240 hour(s)).       Radiology Studies: Portable Chest 1 View  Result Date: 01/24/2019 CLINICAL DATA:  Shortness of breath, history of code positivity EXAM: PORTABLE CHEST 1 VIEW COMPARISON:  01/20/2019 FINDINGS: Cardiac shadow is stable. Lungs are hypoinflated. Patchy airspace opacities are noted bilaterally consistent with the given clinical history. The overall appearance is slightly progressed in the right base but otherwise stable. No bony abnormality is noted. IMPRESSION: Patchy infiltrates throughout both lungs slightly worse in the right lung base consistent with the patient's given clinical history. Electronically Signed   By: Inez Catalina M.D.   On: 01/24/2019 15:37        Scheduled Meds: . aspirin EC  81 mg Oral Daily  . enoxaparin (LOVENOX) injection  40 mg Subcutaneous Q24H  . folic acid  1 mg Oral Daily  . insulin aspart  0-15  Units Subcutaneous Q4H  . Ipratropium-Albuterol  1 puff Inhalation Q6H  . methylPREDNISolone (SOLU-MEDROL) injection  40 mg Intravenous TID  . thiamine  100 mg Oral Daily  . vitamin C  500 mg Oral Daily  . zinc sulfate  220 mg Oral Daily   Continuous Infusions: . sodium chloride 75 mL/hr at 01/24/19 1756  . remdesivir 100 mg in NS 250 mL       LOS: 1 day   The patient is critically ill with multiple organ systems failure and requires high complexity decision making for assessment and support, frequent evaluation and titration of therapies, application of advanced monitoring technologies and extensive interpretation of multiple databases. Critical Care Time devoted to patient care services described in this note  Time spent: 40 minutes     Yi Falletta, Geraldo Docker, MD [facility] Pager [phone]  If 7PM-7AM, please contact night-coverage [email or web address] Password [facility] 01/25/2019, 10:28 AM

## 2019-01-25 NOTE — Progress Notes (Signed)
CRITICAL VALUE ALERT  Critical Value:  *COVID-19 positive  Date & Time Notied:  *01/25/2019  1300  Provider Notified: Sherral Hammers  Orders Received/Actions taken: Waiting for orders

## 2019-01-25 NOTE — Progress Notes (Signed)
01/25/2019  0915  Respiratory notified patient needs Inhaler to be administer by them. Per Abigail Butts, RT she will be down as soon as she finishes. Pt is aware that respiratory will come to assist with inhaler.

## 2019-01-25 NOTE — Progress Notes (Signed)
   01/25/19 0636  Provider Notification  Provider Name/Title Dr. [name]  Date Provider Notified 01/25/19  Time Provider Notified [phone]  Notification Type Page  Notification Reason Change in status  Response No new orders   Received called from Central Telemetry that patient's heart rate was sustaining at 35.  Patient does have history of bradycardia and did see cardiologist at [facility] in 09/2017.  Upon assessment of patient, he is sleeping.  He awakens easily and has no complaints of chest pain or dizziness.  His HR goes into the 50s while awake.  His B/P is 122/79.  He has been in the 40s at time during the night while asleep.  [name] made aware.  He will defer to day team.  I will call if patient becomes symptomatic.  Will continue to monitor patient.  [name] RN

## 2019-01-25 NOTE — Progress Notes (Signed)
01/25/2019  1400  Respiratory was called to do inhaler. Per RT will come shortly.

## 2019-01-25 NOTE — Progress Notes (Signed)
Called and updated patient's wife, Verdis Frederickson, via interpreter on patient's condition.  All questions answered.  She will have her daughter call later with further questions.  [name] RN

## 2019-01-26 LAB — CBC WITH DIFFERENTIAL/PLATELET
Abs Immature Granulocytes: 0.35 10*3/uL — ABNORMAL HIGH (ref 0.00–0.07)
Basophils Absolute: 0.1 10*3/uL (ref 0.0–0.1)
Basophils Relative: 0 %
Eosinophils Absolute: 0 10*3/uL (ref 0.0–0.5)
Eosinophils Relative: 0 %
HCT: 42.2 % (ref 39.0–52.0)
Hemoglobin: 14.4 g/dL (ref 13.0–17.0)
Immature Granulocytes: 3 %
Lymphocytes Relative: 9 %
Lymphs Abs: 1.2 10*3/uL (ref 0.7–4.0)
MCH: 29.2 pg (ref 26.0–34.0)
MCHC: 34.1 g/dL (ref 30.0–36.0)
MCV: 85.6 fL (ref 80.0–100.0)
Monocytes Absolute: 0.5 10*3/uL (ref 0.1–1.0)
Monocytes Relative: 4 %
Neutro Abs: 11.3 10*3/uL — ABNORMAL HIGH (ref 1.7–7.7)
Neutrophils Relative %: 84 %
Platelets: 334 10*3/uL (ref 150–400)
RBC: 4.93 MIL/uL (ref 4.22–5.81)
RDW: 11.6 % (ref 11.5–15.5)
WBC: 13.4 10*3/uL — ABNORMAL HIGH (ref 4.0–10.5)
nRBC: 0 % (ref 0.0–0.2)

## 2019-01-26 LAB — COMPREHENSIVE METABOLIC PANEL
ALT: 96 U/L — ABNORMAL HIGH (ref 0–44)
AST: 42 U/L — ABNORMAL HIGH (ref 15–41)
Albumin: 2.9 g/dL — ABNORMAL LOW (ref 3.5–5.0)
Alkaline Phosphatase: 49 U/L (ref 38–126)
Anion gap: 9 (ref 5–15)
BUN: 24 mg/dL — ABNORMAL HIGH (ref 6–20)
CO2: 22 mmol/L (ref 22–32)
Calcium: 8.2 mg/dL — ABNORMAL LOW (ref 8.9–10.3)
Chloride: 105 mmol/L (ref 98–111)
Creatinine, Ser: 0.58 mg/dL — ABNORMAL LOW (ref 0.61–1.24)
GFR calc Af Amer: >60 mL/min (ref >60)
GFR calc non Af Amer: >60 mL/min (ref >60)
Glucose, Bld: 136 mg/dL — ABNORMAL HIGH (ref 70–99)
Potassium: 4.4 mmol/L (ref 3.5–5.1)
Sodium: 136 mmol/L (ref 135–145)
Total Bilirubin: 0.4 mg/dL (ref 0.3–1.2)
Total Protein: 6.1 g/dL — ABNORMAL LOW (ref 6.5–8.1)

## 2019-01-26 LAB — GLUCOSE, CAPILLARY
Glucose-Capillary: 131 mg/dL — ABNORMAL HIGH (ref 70–99)
Glucose-Capillary: 142 mg/dL — ABNORMAL HIGH (ref 70–99)
Glucose-Capillary: 149 mg/dL — ABNORMAL HIGH (ref 70–99)
Glucose-Capillary: 153 mg/dL — ABNORMAL HIGH (ref 70–99)
Glucose-Capillary: 164 mg/dL — ABNORMAL HIGH (ref 70–99)
Glucose-Capillary: 166 mg/dL — ABNORMAL HIGH (ref 70–99)
Glucose-Capillary: 228 mg/dL — ABNORMAL HIGH (ref 70–99)

## 2019-01-26 LAB — CK: Total CK: 57 U/L (ref 49–397)

## 2019-01-26 LAB — C-REACTIVE PROTEIN: CRP: 1.3 mg/dL — ABNORMAL HIGH (ref <1.0)

## 2019-01-26 LAB — LEGIONELLA PNEUMOPHILA SEROGP 1 UR AG: L. pneumophila Serogp 1 Ur Ag: NEGATIVE

## 2019-01-26 LAB — HEPATITIS B SURFACE ANTIGEN: Hepatitis B Surface Ag: NEGATIVE

## 2019-01-26 LAB — GLUCOSE 6 PHOSPHATE DEHYDROGENASE
G6PDH: 8 U/g{Hb} (ref 4.6–13.5)
Hemoglobin: 16.2 g/dL (ref 13.0–17.7)

## 2019-01-26 LAB — FERRITIN: Ferritin: 315 ng/mL (ref 24–336)

## 2019-01-26 LAB — MAGNESIUM: Magnesium: 2.3 mg/dL (ref 1.7–2.4)

## 2019-01-26 LAB — PHOSPHORUS: Phosphorus: 3.2 mg/dL (ref 2.5–4.6)

## 2019-01-26 LAB — INTERLEUKIN-6, PLASMA: Interleukin-6, Plasma: 1.4 pg/mL (ref 0.0–12.2)

## 2019-01-26 LAB — D-DIMER, QUANTITATIVE: D-Dimer, Quant: <0.27 ug/mL-FEU (ref 0.00–0.50)

## 2019-01-26 MED ORDER — MENTHOL 3 MG MT LOZG
1.0000 | LOZENGE | OROMUCOSAL | Status: DC | PRN
  Administered 2019-01-26: 03:00:00 3 mg via ORAL
  Filled 2019-01-26: qty 9

## 2019-01-26 MED ORDER — PHENOL 1.4 % MT LIQD
1.0000 | OROMUCOSAL | Status: DC | PRN
  Filled 2019-01-26: qty 177

## 2019-01-26 NOTE — Progress Notes (Signed)
PROGRESS NOTE    Randall Gill  ZOX:[id number] DOB: [birth year] DOA: 01/24/2019 PCP: Everlean CherryWhyte, [name], MD   Brief Narrative:  48 y.o. HM  PMHx HTN, bradycardia, HLD, OSA obese, OA,   [facility]7 complaining of being diagnosed with COVID 2 weeks ago and since then has had a productive cough, fevers, generalized weakness, sore throat and shortness of breath. States that his shortness of breath is not present at rest but when he tried to move and coughs it makes him sob. Has cp only with cough. He has had decreased PO intake secondary to his sxs. He had diarrhea 2 days ago that is since resolved diagnosed with left lower lobe pneumonia and possibly active COVID infection.  Treated with doxycycline advised to self quarantine and if infection worsened return to ED  Subjective: 7/13 A/O x4, negative CP, negative S OB, negative abdominal pain.  Continued cough overnight with exertion   Assessment & Plan:   Active Problems:   Pneumonia due to COVID-19 virus   Essential hypertension   Bradycardia   HLD (hyperlipidemia)   OSA (obstructive sleep apnea)   Obese   OA (osteoarthritis)   COVID pneumonia - Patient does not use home O2. -  7/13 currently on 2 L O2 via [place] to maintain SPO2 97% Previous PCXR on 7/11 PCXR shows bilateral patchy opacifications consistent with viral pneumonia most likely COVID. - Solu-Medrol 40 mg 3 times daily - Patient meets guidelines for Remdesivir per pharmacy guideline - Also counseled patient dependent upon the COVID inflammatory markers may need to start Actemra.  Patient verifies no previous history of liver disease, or TB. - Daily COVID inflammatory markers pending -Titrate O2 to maintain SPO2> 93% - If required prone patient 2 to 3 hours every 12 hours in order to help maintain SPO2 goal. Recent Labs  Lab 01/24/19 1508 01/25/19 0210 01/26/19 0230  CRP 9.0* 4.9* 1.3*    OSA  -Titrate O2 to maintain SPO2> 93%.  At night if  required use HF Hauser and lieu of CPAP  Essential HTN -Currently BP controlled without medication.  Will monitor call from  Bradycardia -  Patient has had episodes of bradycardia/pauses overnight asymptomatic.  Amp of atropine at bedside, pacer at bedside in case patient has symptoms again.  Inferior MI on EKG - Negative cardiac CP. - Troponin high-sensitivity negative   OA    DVT prophylaxis: Lovenox Code Status: Full Family Communication: None Disposition Plan: TBD   Consultants:  None  Procedures/Significant Events:  7/7 PCXR:Subtle ill-defined opacity left lower lobe, concerning for early pneumonia.   ---------------------------------------------------------------------------------------------------------------- 7/11 PCXR: bilateral patchy opacifications consistent with viral pneumonia most likely COVID.    I have personally reviewed and interpreted all radiology studies and my findings are as above.  VENTILATOR SETTINGS:   Cultures 7/10 [facility]  *  Antimicrobials: Anti-infectives (From admission, onward)   Start     Stop   01/25/19 1730  remdesivir 100 mg in sodium chloride 0.9 % 250 mL IVPB     01/29/19 1729   01/24/19 1730  remdesivir 200 mg in sodium chloride 0.9 % 250 mL IVPB     01/24/19 1830       Devices    LINES / TUBES:      Continuous Infusions:  sodium chloride 75 mL/hr at 01/26/19 0423   remdesivir 100 mg in NS 250 mL 100 mg (01/25/19 1722)     Objective: Vitals:  01/26/19 0400 01/26/19 0500 01/26/19 0600 01/26/19 0759  BP: 116/84   120/84  Pulse: (!) 55 (!) 42 (!) 41 (!) 51  Resp: (!) 29 (!) 22 14 (!) 28  Temp:    98.1 F (36.7 C)  TempSrc:    Oral  SpO2: (!) 88% 96% 94% 97%  Weight:      Height:        Intake/Output Summary (Last 24 hours) at 01/26/2019 [id number] Last data filed at 01/26/2019 0600 Gross per 24 hour  Intake 1892.65 ml  Output 1400 ml  Net 492.65 ml   Filed  Weights   01/24/19 1728 01/24/19 1748  Weight: 77.5 kg 77.5 kg   Physical Exam:  General: A/O x4, positive acute respiratory distress currently on 2 L O2 via [place] to maintain SPO2 97% Eyes: negative scleral hemorrhage, negative anisocoria, negative icterus ENT: Negative Runny nose, negative gingival bleeding, Neck:  Negative scars, masses, torticollis, lymphadenopathy, JVD Lungs: Clear to auscultation bilaterally without wheezes or crackles Cardiovascular: Regular rate and rhythm without murmur gallop or rub normal S1 and S2 Abdomen: negative abdominal pain, nondistended, positive soft, bowel sounds, no rebound, no ascites, no appreciable mass Extremities: No significant cyanosis, clubbing, or edema bilateral lower extremities Skin: Negative rashes, lesions, ulcers Psychiatric:  Negative depression, negative anxiety, negative fatigue, negative mania  Central nervous system:  Cranial nerves II through XII intact, tongue/uvula midline, all extremities muscle strength 5/5, sensation intact throughout,  negative dysarthria, negative expressive aphasia, negative receptive aphasia. .     Data Reviewed: Care during the described time interval was provided by me .  I have reviewed this patient's available data, including medical history, events of note, physical examination, and all test results as part of my evaluation.   CBC: Recent Labs  Lab 01/24/19 1508 01/25/19 0210 01/26/19 0230  WBC 7.7 10.9* 13.4*  NEUTROABS 6.2 9.0* 11.3*  HGB 15.9 14.2 14.4  HCT 46.2 42.7 42.2  MCV 84.5 85.4 85.6  PLT 310 323 334   Basic Metabolic Panel: Recent Labs  Lab 01/24/19 1508 01/25/19 0210 01/25/19 1105 01/26/19 0230  NA 138  --  136 136  K 4.1  --  4.0 4.4  CL 100  --  102 105  CO2 25  --  25 22  GLUCOSE 131*  --  124* 136*  BUN 20  --  25* 24*  CREATININE 0.66  --  0.67 0.58*  CALCIUM 9.0  --  8.3* 8.2*  MG 2.5* 2.3  --  2.3  PHOS  --  3.5  --  3.2   GFR: Estimated Creatinine  Clearance: 107.4 mL/min (A) (by C-G formula based on SCr of 0.58 mg/dL (L)). Liver Function Tests: Recent Labs  Lab 01/24/19 1508 01/25/19 1105 01/26/19 0230  AST 43* 59* 42*  ALT 55* 100* 96*  ALKPHOS 53 50 49  BILITOT 0.8 0.5 0.4  PROT 7.4 6.3* 6.1*  ALBUMIN 3.4* 3.0* 2.9*   No results for input(s): LIPASE, AMYLASE in the last 168 hours. No results for input(s): AMMONIA in the last 168 hours. Coagulation Profile: No results for input(s): INR, PROTIME in the last 168 hours. Cardiac Enzymes: Recent Labs  Lab 01/25/19 0210 01/26/19 0230  CKTOTAL 81 57   BNP (last 3 results) No results for input(s): PROBNP in the last 8760 hours. HbA1C: Recent Labs    01/24/19 1508  HGBA1C 5.6   CBG: Recent Labs  Lab 01/25/19 1630 01/25/19 2017 01/26/19 0018 01/26/19 0343 01/26/19 [id number]  GLUCAP 169* 180* 153* 142* 131*   Lipid Profile: No results for input(s): CHOL, HDL, LDLCALC, TRIG, CHOLHDL, LDLDIRECT in the last 72 hours. Thyroid Function Tests: No results for input(s): TSH, T4TOTAL, FREET4, T3FREE, THYROIDAB in the last 72 hours. Anemia Panel: Recent Labs    01/25/19 0210 01/26/19 0230  FERRITIN 452* 315   Urine analysis: No results found for: COLORURINE, APPEARANCEUR, LABSPEC, PHURINE, GLUCOSEU, HGBUR, BILIRUBINUR, KETONESUR, PROTEINUR, UROBILINOGEN, NITRITE, LEUKOCYTESUR Sepsis Labs: @LABRCNTIP (procalcitonin:4,lacticidven:4)  ) Recent Results (from the past 240 hour(s))  Culture, blood (Routine X 2) w Reflex to ID Panel     Status: None (Preliminary result)   Collection Time: 01/24/19  3:08 PM   Specimen: BLOOD  Result Value Ref Range Status   Specimen Description   Final    BLOOD LEFT ANTECUBITAL Performed at [facility], [address] [place], [place] [address]    Special Requests   Final    BOTTLES DRAWN AEROBIC ONLY Blood Culture adequate volume Performed at [facility], [address] [place], [place]  [id number]    Culture   Final    NO GROWTH < 24 HOURS Performed at [facility], [address] [place], [place] [address]    Report Status PENDING  Incomplete  Culture, blood (Routine X 2) w Reflex to ID Panel     Status: None (Preliminary result)   Collection Time: 01/24/19  3:12 PM   Specimen: BLOOD  Result Value Ref Range Status   Specimen Description   Final    BLOOD RIGHT HAND Performed at [facility], [address] [place], [place] [address]    Special Requests   Final    BOTTLES DRAWN AEROBIC ONLY Blood Culture adequate volume Performed at [facility], [address] [place], [place] [address]    Culture   Final    NO GROWTH < 24 HOURS Performed at [facility], [address] [place], [place] [address]    Report Status PENDING  Incomplete  SARS Coronavirus 2 [facility](Hospital order, Performed in [facility])     Status: Abnormal   Collection Time: 01/25/19  7:00 AM   Specimen: Nasopharyngeal Swab  Result Value Ref Range Status   SARS Coronavirus 2 POSITIVE (A) NEGATIVE Final    Comment: RESULT CALLED TO, READ BACK BY AND VERIFIED WITH: C CARPENTER,RN 01/25/19 1320 RHOLMES (NOTE) If result is NEGATIVE SARS-CoV-2 target nucleic acids are NOT DETECTED. The SARS-CoV-2 RNA is generally detectable in upper and lower  respiratory specimens during the acute phase of infection. The lowest  concentration of SARS-CoV-2 viral copies this assay can detect is 250  copies / mL. A negative result does not preclude SARS-CoV-2 infection  and should not be used as the sole basis for treatment or other  patient management decisions.  A negative result may occur with  improper specimen collection / handling, submission of specimen other  than nasopharyngeal swab, presence of viral mutation(s) within the  areas targeted by this assay, and inadequate number of viral copies  (<250 copies / mL). A negative result must be combined with  clinical  observations, patient history, and epidemiological information. If result is POSITIVE SARS-CoV-2 target nucleic acids are DETECTED. T he SARS-CoV-2 RNA is generally detectable in upper and lower  respiratory specimens during the acute phase of infection.  Positive  results are indicative of active infection with SARS-CoV-2.  Clinical  correlation with patient history and  other diagnostic information is  necessary to determine patient infection status.  Positive results do  not rule out bacterial infection or co-infection with other viruses. If result is PRESUMPTIVE POSTIVE SARS-CoV-2 nucleic acids MAY BE PRESENT.   A presumptive positive result was obtained on the submitted specimen  and confirmed on repeat testing.  While 2019 novel coronavirus  (SARS-CoV-2) nucleic acids may be present in the submitted sample  additional confirmatory testing may be necessary for epidemiological  and / or clinical management purposes  to differentiate between  SARS-CoV-2 and other Sarbecovirus currently known to infect humans.  If clinically indicated additional testing with an alternate test  methodology [phone]) is  advised. The SARS-CoV-2 RNA is generally  detectable in upper and lower respiratory specimens during the acute  phase of infection. The expected result is Negative. Fact Sheet for Patients:  StrictlyIdeas.no Fact Sheet for Healthcare Providers: BankingDealers.co.za This test is not yet approved or cleared by the [place] FDA and has been authorized for detection and/or diagnosis of SARS-CoV-2 by FDA under an Emergency Use Authorization (EUA).  This EUA will remain in effect (meaning this test can be used) for the duration of the COVID-19 declaration under Section 564(b)(1) of the Act, 21 U.S.C. section 360bbb-3(b)(1), unless the authorization is terminated or revoked sooner. Performed at [facility],  [place] [address] [place], [place] [address]          Radiology Studies: Portable Chest 1 View  Result Date: 01/24/2019 CLINICAL DATA:  Shortness of breath, history of code positivity EXAM: PORTABLE CHEST 1 VIEW COMPARISON:  01/20/2019 FINDINGS: Cardiac shadow is stable. Lungs are hypoinflated. Patchy airspace opacities are noted bilaterally consistent with the given clinical history. The overall appearance is slightly progressed in the right base but otherwise stable. No bony abnormality is noted. IMPRESSION: Patchy infiltrates throughout both lungs slightly worse in the right lung base consistent with the patient's given clinical history. Electronically Signed   By: Inez Catalina M.D.   On: 01/24/2019 15:37        Scheduled Meds:  aspirin EC  81 mg Oral Daily   enoxaparin (LOVENOX) injection  40 mg Subcutaneous I62V   folic acid  1 mg Oral Daily   insulin aspart  0-15 Units Subcutaneous Q4H   Ipratropium-Albuterol  1 puff Inhalation Q6H   methylPREDNISolone (SOLU-MEDROL) injection  40 mg Intravenous TID   thiamine  100 mg Oral Daily   vitamin C  500 mg Oral Daily   zinc sulfate  220 mg Oral Daily   Continuous Infusions:  sodium chloride 75 mL/hr at 01/26/19 0423   remdesivir 100 mg in NS 250 mL 100 mg (01/25/19 1722)     LOS: 2 days   The patient is critically ill with multiple organ systems failure and requires high complexity decision making for assessment and support, frequent evaluation and titration of therapies, application of advanced monitoring technologies and extensive interpretation of multiple databases. Critical Care Time devoted to patient care services described in this note  Time spent: 40 minutes     Sacheen Arrasmith, Geraldo Docker, MD [facility] Pager [phone]  If 7PM-7AM, please contact night-coverage [email or web address] Password [facility] 01/26/2019, 8:14 AM

## 2019-01-27 LAB — COMPREHENSIVE METABOLIC PANEL
ALT: 94 U/L — ABNORMAL HIGH (ref 0–44)
AST: 41 U/L (ref 15–41)
Albumin: 2.9 g/dL — ABNORMAL LOW (ref 3.5–5.0)
Alkaline Phosphatase: 49 U/L (ref 38–126)
Anion gap: 10 (ref 5–15)
BUN: 21 mg/dL — ABNORMAL HIGH (ref 6–20)
CO2: 22 mmol/L (ref 22–32)
Calcium: 7.9 mg/dL — ABNORMAL LOW (ref 8.9–10.3)
Chloride: 105 mmol/L (ref 98–111)
Creatinine, Ser: 0.66 mg/dL (ref 0.61–1.24)
GFR calc Af Amer: >60 mL/min (ref >60)
GFR calc non Af Amer: >60 mL/min (ref >60)
Glucose, Bld: 116 mg/dL — ABNORMAL HIGH (ref 70–99)
Potassium: 4 mmol/L (ref 3.5–5.1)
Sodium: 137 mmol/L (ref 135–145)
Total Bilirubin: 0.3 mg/dL (ref 0.3–1.2)
Total Protein: 5.9 g/dL — ABNORMAL LOW (ref 6.5–8.1)

## 2019-01-27 LAB — CBC WITH DIFFERENTIAL/PLATELET
Abs Immature Granulocytes: 0.4 10*3/uL — ABNORMAL HIGH (ref 0.00–0.07)
Basophils Absolute: 0 10*3/uL (ref 0.0–0.1)
Basophils Relative: 0 %
Eosinophils Absolute: 0 10*3/uL (ref 0.0–0.5)
Eosinophils Relative: 0 %
HCT: 42.7 % (ref 39.0–52.0)
Hemoglobin: 14.7 g/dL (ref 13.0–17.0)
Lymphocytes Relative: 6 %
Lymphs Abs: 0.9 10*3/uL (ref 0.7–4.0)
MCH: 29.2 pg (ref 26.0–34.0)
MCHC: 34.4 g/dL (ref 30.0–36.0)
MCV: 84.9 fL (ref 80.0–100.0)
Monocytes Absolute: 0.3 10*3/uL (ref 0.1–1.0)
Monocytes Relative: 2 %
Myelocytes: 3 %
Neutro Abs: 12.7 10*3/uL — ABNORMAL HIGH (ref 1.7–7.7)
Neutrophils Relative %: 89 %
Platelets: 380 10*3/uL (ref 150–400)
RBC: 5.03 MIL/uL (ref 4.22–5.81)
RDW: 11.6 % (ref 11.5–15.5)
WBC: 14.3 10*3/uL — ABNORMAL HIGH (ref 4.0–10.5)
nRBC: 0 % (ref 0.0–0.2)

## 2019-01-27 LAB — GLUCOSE, CAPILLARY
Glucose-Capillary: 149 mg/dL — ABNORMAL HIGH (ref 70–99)
Glucose-Capillary: 99 mg/dL (ref 70–99)
Glucose-Capillary: 123 mg/dL — ABNORMAL HIGH (ref 70–99)
Glucose-Capillary: 192 mg/dL — ABNORMAL HIGH (ref 70–99)
Glucose-Capillary: 205 mg/dL — ABNORMAL HIGH (ref 70–99)

## 2019-01-27 LAB — C-REACTIVE PROTEIN: CRP: <0.8 mg/dL (ref <1.0)

## 2019-01-27 LAB — MAGNESIUM: Magnesium: 2.3 mg/dL (ref 1.7–2.4)

## 2019-01-27 LAB — D-DIMER, QUANTITATIVE: D-Dimer, Quant: <0.27 ug/mL-FEU (ref 0.00–0.50)

## 2019-01-27 LAB — PHOSPHORUS: Phosphorus: 3.5 mg/dL (ref 2.5–4.6)

## 2019-01-27 LAB — CK: Total CK: 44 U/L — ABNORMAL LOW (ref 49–397)

## 2019-01-27 LAB — FERRITIN: Ferritin: 314 ng/mL (ref 24–336)

## 2019-01-27 NOTE — Progress Notes (Signed)
PROGRESS NOTE    Randall Gill  HCW:[id number] DOB: 13-Apr-1971 DOA: 01/24/2019 PCP: Maris Berger, MD   Brief Narrative:  48 y.o. HM  PMHx HTN, bradycardia, HLD, OSA obese, OA,   Presented to ED at [facility] long on 7/7 complaining of being diagnosed with COVID 2 weeks ago and since then has had a productive cough, fevers, generalized weakness, sore throat and shortness of breath. States that his shortness of breath is not present at rest but when he tried to move and coughs it makes him sob. Has cp only with cough. He has had decreased PO intake secondary to his sxs. He had diarrhea 2 days ago that is since resolved diagnosed with left lower lobe pneumonia and possibly active COVID infection.  Treated with doxycycline advised to self quarantine and if infection worsened return to ED  Subjective: 7/14 A/O x4, negative CP, positive S OB, negative abdominal pain.  Continued cough with exertion.  Currently on 2 L O2 via [place] to maintain SPO2 97%    A/O x4, negative CP, negative S OB, negative abdominal pain.  Continued cough overnight with exertion   Assessment & Plan:   Active Problems:   Pneumonia due to COVID-19 virus   Essential hypertension   Bradycardia   HLD (hyperlipidemia)   OSA (obstructive sleep apnea)   Obese   OA (osteoarthritis)   COVID pneumonia - Patient does not use home O2.  Previous PCXR on 7/11 PCXR shows bilateral patchy opacifications consistent with viral pneumonia most likely COVID. -  7/14 currently on 2 L O2 via  to maintain SPO2 97%.  During exam turned O2 off patient SPO2 decreased to 93%, hopefully will be able to maintain that level throughout the day - Solu-Medrol 40 mg 3 times daily - Patient meets guidelines for Remdesivir per pharmacy guideline - Also counseled patient dependent upon the COVID inflammatory markers may need to start Actemra.  Patient verifies no previous history of liver disease, or TB. - Daily COVID inflammatory markers  pending -Titrate O2 to maintain SPO2> 93% - If required prone patient 2 to 3 hours every 12 hours in order to help maintain SPO2 goal. Recent Labs  Lab 01/24/19 1508 01/25/19 0210 01/26/19 0230 01/27/19 0220  CRP 9.0* 4.9* 1.3* <0.8    OSA  -Titrate O2 to maintain SPO2> 93%.  At night if required use HF  and lieu of CPAP  Essential HTN -Currently BP controlled without medication.  Will monitor call from  Bradycardia -  Patient has had episodes of bradycardia/pauses overnight asymptomatic.  Amp of atropine at bedside, pacer at bedside in case patient has symptoms again. -No further cases of bradycardia.  Inferior MI on EKG - Negative cardiac CP. - Troponin high-sensitivity negative   OA    DVT prophylaxis: Lovenox Code Status: Full Family Communication: None Disposition Plan: TBD   Consultants:  None  Procedures/Significant Events:  7/7 PCXR:Subtle ill-defined opacity left lower lobe, concerning for early pneumonia.   ---------------------------------------------------------------------------------------------------------------- 7/11 PCXR: bilateral patchy opacifications consistent with viral pneumonia most likely COVID.    I have personally reviewed and interpreted all radiology studies and my findings are as above.  VENTILATOR SETTINGS:   Cultures 7/10 coronavirus positive at [facility] 7/11 blood LEFT antecubital NGTD 7/11 blood right hand NGTD 7/11 hepatitis B negative 7/11 HIV negative 7/12 SARS coronavirus positive *  Antimicrobials: Anti-infectives (From admission, onward)   Start     Stop   01/25/19 1730  remdesivir 100 mg in sodium chloride  0.9 % 250 mL IVPB     01/29/19 1729   01/24/19 1730  remdesivir 200 mg in sodium chloride 0.9 % 250 mL IVPB     01/24/19 1830       Devices    LINES / TUBES:      Continuous Infusions:  sodium chloride 75 mL/hr at 01/27/19 0400   remdesivir 100 mg in NS 250 mL 100 mg  (01/26/19 1645)     Objective: Vitals:   01/26/19 2001 01/27/19 0300 01/27/19 0400 01/27/19 0700  BP: 121/86  113/75 125/88  Pulse:  (!) 40 (!) 44 (!) 42  Resp:  20 (!) 26 17  Temp: 98.9 F (37.2 C)  98.4 F (36.9 C) 97.6 F (36.4 C)  TempSrc: Oral  Oral Oral  SpO2:  94% 93% 96%  Weight:      Height:        Intake/Output Summary (Last 24 hours) at 01/27/2019 [id number] Last data filed at 01/27/2019 0800 Gross per 24 hour  Intake 1620.15 ml  Output 2000 ml  Net -379.85 ml   Filed Weights   01/24/19 1728 01/24/19 1748  Weight: 77.5 kg 77.5 kg   Physical Exam:  General: A/O x4, positive acute respiratory distress currently on 2 L O2 via [place] to maintain SPO2 97% Eyes: negative scleral hemorrhage, negative anisocoria, negative icterus ENT: Negative Runny nose, negative gingival bleeding, Neck:  Negative scars, masses, torticollis, lymphadenopathy, JVD Lungs: Clear to auscultation bilaterally without wheezes or crackles Cardiovascular: Regular rate and rhythm without murmur gallop or rub normal S1 and S2 Abdomen: negative abdominal pain, nondistended, positive soft, bowel sounds, no rebound, no ascites, no appreciable mass Extremities: No significant cyanosis, clubbing, or edema bilateral lower extremities Skin: Negative rashes, lesions, ulcers Psychiatric:  Negative depression, negative anxiety, negative fatigue, negative mania  Central nervous system:  Cranial nerves II through XII intact, tongue/uvula midline, all extremities muscle strength 5/5, sensation intact throughout, negative dysarthria, negative expressive aphasia, negative receptive aphasia..     Data Reviewed: Care during the described time interval was provided by me .  I have reviewed this patient's available data, including medical history, events of note, physical examination, and all test results as part of my evaluation.   CBC: Recent Labs  Lab 01/24/19 1508 01/25/19 0210 01/26/19 0230 01/27/19 0220  WBC  7.7 10.9* 13.4* 14.3*  NEUTROABS 6.2 9.0* 11.3* 12.7*  HGB 15.9   16.2 14.2 14.4 14.7  HCT 46.2 42.7 42.2 42.7  MCV 84.5 85.4 85.6 84.9  PLT 310 323 334 380   Basic Metabolic Panel: Recent Labs  Lab 01/24/19 1508 01/25/19 0210 01/25/19 1105 01/26/19 0230 01/27/19 0220  NA 138  --  136 136 137  K 4.1  --  4.0 4.4 4.0  CL 100  --  102 105 105  CO2 25  --  25 22 22   GLUCOSE 131*  --  124* 136* 116*  BUN 20  --  25* 24* 21*  CREATININE 0.66  --  0.67 0.58* 0.66  CALCIUM 9.0  --  8.3* 8.2* 7.9*  MG 2.5* 2.3  --  2.3 2.3  PHOS  --  3.5  --  3.2 3.5   GFR: Estimated Creatinine Clearance: 107.4 mL/min (by C-G formula based on SCr of 0.66 mg/dL). Liver Function Tests: Recent Labs  Lab 01/24/19 1508 01/25/19 1105 01/26/19 0230 01/27/19 0220  AST 43* 59* 42* 41  ALT 55* 100* 96* 94*  ALKPHOS 53 50 49 49  BILITOT  0.8 0.5 0.4 0.3  PROT 7.4 6.3* 6.1* 5.9*  ALBUMIN 3.4* 3.0* 2.9* 2.9*   No results for input(s): LIPASE, AMYLASE in the last 168 hours. No results for input(s): AMMONIA in the last 168 hours. Coagulation Profile: No results for input(s): INR, PROTIME in the last 168 hours. Cardiac Enzymes: Recent Labs  Lab 01/25/19 0210 01/26/19 0230 01/27/19 0220  CKTOTAL 81 57 44*   BNP (last 3 results) No results for input(s): PROBNP in the last 8760 hours. HbA1C: Recent Labs    01/24/19 1508  HGBA1C 5.6   CBG: Recent Labs  Lab 01/26/19 0758 01/26/19 1638 01/26/19 2007 01/26/19 2336 01/27/19 0811  GLUCAP 131* 164* 228* 166* 99   Lipid Profile: No results for input(s): CHOL, HDL, LDLCALC, TRIG, CHOLHDL, LDLDIRECT in the last 72 hours. Thyroid Function Tests: No results for input(s): TSH, T4TOTAL, FREET4, T3FREE, THYROIDAB in the last 72 hours. Anemia Panel: Recent Labs    01/26/19 0230 01/27/19 0220  FERRITIN 315 314   Urine analysis: No results found for: COLORURINE, APPEARANCEUR, LABSPEC, PHURINE, GLUCOSEU, HGBUR, BILIRUBINUR, KETONESUR, PROTEINUR,  UROBILINOGEN, NITRITE, LEUKOCYTESUR Sepsis Labs: @LABRCNTIP (procalcitonin:4,lacticidven:4)  ) Recent Results (from the past 240 hour(s))  Culture, blood (Routine X 2) w Reflex to ID Panel     Status: None (Preliminary result)   Collection Time: 01/24/19  3:08 PM   Specimen: BLOOD  Result Value Ref Range Status   Specimen Description   Final    BLOOD LEFT ANTECUBITAL Performed at [facility], [address] [place], [place] [address]    Special Requests   Final    BOTTLES DRAWN AEROBIC ONLY Blood Culture adequate volume Performed at [facility], [address] [place], [place] [address]    Culture   Final    NO GROWTH 2 DAYS Performed at [facility], [address] [place], [place] [address]    Report Status PENDING  Incomplete  Culture, blood (Routine X 2) w Reflex to ID Panel     Status: None (Preliminary result)   Collection Time: 01/24/19  3:12 PM   Specimen: BLOOD  Result Value Ref Range Status   Specimen Description   Final    BLOOD RIGHT HAND Performed at [facility], [address] [place], [place] [address]    Special Requests   Final    BOTTLES DRAWN AEROBIC ONLY Blood Culture adequate volume Performed at [facility], [address] [place], [place] [address]    Culture   Final    NO GROWTH 2 DAYS Performed at [facility], [address] [place], [place] [address]    Report Status PENDING  Incomplete  SARS Coronavirus 2 [facility](Hospital order, Performed in [facility] hospital lab)     Status: Abnormal   Collection Time: 01/25/19  7:00 AM   Specimen: Nasopharyngeal Swab  Result Value Ref Range Status   SARS Coronavirus 2 POSITIVE (A) NEGATIVE Final    Comment: RESULT CALLED TO, READ BACK BY AND VERIFIED WITH: C CARPENTER,RN 01/25/19 1320 RHOLMES (NOTE) If result is NEGATIVE SARS-CoV-2 target nucleic acids are NOT DETECTED. The SARS-CoV-2 RNA is generally detectable in  upper and lower  respiratory specimens during the acute phase of infection. The lowest  concentration of SARS-CoV-2 viral copies this assay can detect is 250  copies / mL. A negative result does not preclude SARS-CoV-2 infection  and should not be used as the sole basis for treatment or other  patient management decisions.  A negative result may occur with  improper specimen collection / handling, submission of specimen other  than nasopharyngeal swab, presence of viral mutation(s) within the  areas targeted by this assay, and inadequate number of viral copies  (<250 copies / mL). A negative result must be combined with clinical  observations, patient history, and epidemiological information. If result is POSITIVE SARS-CoV-2 target nucleic acids are DETECTED. T he SARS-CoV-2 RNA is generally detectable in upper and lower  respiratory specimens during the acute phase of infection.  Positive  results are indicative of active infection with SARS-CoV-2.  Clinical  correlation with patient history and other diagnostic information is  necessary to determine patient infection status.  Positive results do  not rule out bacterial infection or co-infection with other viruses. If result is PRESUMPTIVE POSTIVE SARS-CoV-2 nucleic acids MAY BE PRESENT.   A presumptive positive result was obtained on the submitted specimen  and confirmed on repeat testing.  While 2019 novel coronavirus  (SARS-CoV-2) nucleic acids may be present in the submitted sample  additional confirmatory testing may be necessary for epidemiological  and / or clinical management purposes  to differentiate between  SARS-CoV-2 and other Sarbecovirus currently known to infect humans.  If clinically indicated additional testing with an alternate test  methodology [phone]([id number]) is  advised. The SARS-CoV-2 RNA is generally  detectable in upper and lower respiratory specimens during the acute  phase of infection. The expected result is  Negative. Fact Sheet for Patients:  BoilerBrush.com.cyhttps://www.fda.gov/media/136312/download Fact Sheet for Healthcare Providers: https://pope.com/https://www.fda.gov/media/136313/download This test is not yet approved or cleared by the [place] FDA and has been authorized for detection and/or diagnosis of SARS-CoV-2 by FDA under an Emergency Use Authorization (EUA).  This EUA will remain in effect (meaning this test can be used) for the duration of the COVID-19 declaration under Section 564(b)(1) of the Act, 21 U.S.C. section 360bbb-3(b)(1), unless the authorization is terminated or revoked sooner. Performed at [facility], [address] [place], [place] [address]          Radiology Studies: No results found.      Scheduled Meds:  aspirin EC  81 mg Oral Daily   enoxaparin (LOVENOX) injection  40 mg Subcutaneous Q24H   folic acid  1 mg Oral Daily   insulin aspart  0-15 Units Subcutaneous Q4H   Ipratropium-Albuterol  1 puff Inhalation Q6H   methylPREDNISolone (SOLU-MEDROL) injection  40 mg Intravenous TID   thiamine  100 mg Oral Daily   vitamin C  500 mg Oral Daily   zinc sulfate  220 mg Oral Daily   Continuous Infusions:  sodium chloride 75 mL/hr at 01/27/19 0400   remdesivir 100 mg in NS 250 mL 100 mg (01/26/19 1645)     LOS: 3 days   The patient is critically ill with multiple organ systems failure and requires high complexity decision making for assessment and support, frequent evaluation and titration of therapies, application of advanced monitoring technologies and extensive interpretation of multiple databases. Critical Care Time devoted to patient care services described in this note  Time spent: 40 minutes     Lakera Viall, Roselind MessierURTIS J, MD [facility] Pager [phone]  If 7PM-7AM, please contact night-coverage [email or web address] Password [email or web address] 01/27/2019, 8:26 AM

## 2019-01-27 NOTE — Progress Notes (Signed)
Called spouse from contact list, no answer.

## 2019-01-28 LAB — CBC WITH DIFFERENTIAL/PLATELET
Abs Immature Granulocytes: 0.7 10*3/uL — ABNORMAL HIGH (ref 0.00–0.07)
Basophils Absolute: 0 10*3/uL (ref 0.0–0.1)
Basophils Relative: 0 %
Eosinophils Absolute: 0 10*3/uL (ref 0.0–0.5)
Eosinophils Relative: 0 %
HCT: 43.9 % (ref 39.0–52.0)
Hemoglobin: 14.9 g/dL (ref 13.0–17.0)
Lymphocytes Relative: 6 %
Lymphs Abs: 0.9 10*3/uL (ref 0.7–4.0)
MCH: 28.8 pg (ref 26.0–34.0)
MCHC: 33.9 g/dL (ref 30.0–36.0)
MCV: 84.7 fL (ref 80.0–100.0)
Metamyelocytes Relative: 1 %
Monocytes Absolute: 0.7 10*3/uL (ref 0.1–1.0)
Monocytes Relative: 5 %
Myelocytes: 4 %
Neutro Abs: 12 10*3/uL — ABNORMAL HIGH (ref 1.7–7.7)
Neutrophils Relative %: 84 %
Platelets: 416 10*3/uL — ABNORMAL HIGH (ref 150–400)
RBC: 5.18 MIL/uL (ref 4.22–5.81)
RDW: 11.6 % (ref 11.5–15.5)
WBC: 14.3 10*3/uL — ABNORMAL HIGH (ref 4.0–10.5)
nRBC: 0.2 % (ref 0.0–0.2)

## 2019-01-28 LAB — CK: Total CK: 28 U/L — ABNORMAL LOW (ref 49–397)

## 2019-01-28 LAB — COMPREHENSIVE METABOLIC PANEL
ALT: 125 U/L — ABNORMAL HIGH (ref 0–44)
AST: 45 U/L — ABNORMAL HIGH (ref 15–41)
Albumin: 2.9 g/dL — ABNORMAL LOW (ref 3.5–5.0)
Alkaline Phosphatase: 53 U/L (ref 38–126)
Anion gap: 9 (ref 5–15)
BUN: 20 mg/dL (ref 6–20)
CO2: 24 mmol/L (ref 22–32)
Calcium: 8.1 mg/dL — ABNORMAL LOW (ref 8.9–10.3)
Chloride: 105 mmol/L (ref 98–111)
Creatinine, Ser: 0.72 mg/dL (ref 0.61–1.24)
GFR calc Af Amer: >60 mL/min (ref >60)
GFR calc non Af Amer: >60 mL/min (ref >60)
Glucose, Bld: 126 mg/dL — ABNORMAL HIGH (ref 70–99)
Potassium: 4.1 mmol/L (ref 3.5–5.1)
Sodium: 138 mmol/L (ref 135–145)
Total Bilirubin: 0.4 mg/dL (ref 0.3–1.2)
Total Protein: 5.8 g/dL — ABNORMAL LOW (ref 6.5–8.1)

## 2019-01-28 LAB — GLUCOSE, CAPILLARY
Glucose-Capillary: 141 mg/dL — ABNORMAL HIGH (ref 70–99)
Glucose-Capillary: 160 mg/dL — ABNORMAL HIGH (ref 70–99)
Glucose-Capillary: 169 mg/dL — ABNORMAL HIGH (ref 70–99)
Glucose-Capillary: 171 mg/dL — ABNORMAL HIGH (ref 70–99)
Glucose-Capillary: 89 mg/dL (ref 70–99)

## 2019-01-28 LAB — MAGNESIUM: Magnesium: 2.3 mg/dL (ref 1.7–2.4)

## 2019-01-28 LAB — D-DIMER, QUANTITATIVE: D-Dimer, Quant: <0.27 ug/mL-FEU (ref 0.00–0.50)

## 2019-01-28 LAB — PHOSPHORUS: Phosphorus: 3.7 mg/dL (ref 2.5–4.6)

## 2019-01-28 LAB — FERRITIN: Ferritin: 287 ng/mL (ref 24–336)

## 2019-01-28 LAB — C-REACTIVE PROTEIN: CRP: <0.8 mg/dL (ref <1.0)

## 2019-01-28 MED ORDER — METHYLPREDNISOLONE SODIUM SUCC 40 MG IJ SOLR
40.0000 mg | Freq: Every day | INTRAMUSCULAR | Status: DC
  Administered 2019-01-29: 09:00:00 40 mg via INTRAVENOUS
  Filled 2019-01-28: qty 1

## 2019-01-28 MED ORDER — METHYLPREDNISOLONE SODIUM SUCC 40 MG IJ SOLR
40.0000 mg | Freq: Two times a day (BID) | INTRAMUSCULAR | Status: DC
  Administered 2019-01-28: 09:00:00 40 mg via INTRAVENOUS
  Filled 2019-01-28: qty 1

## 2019-01-28 MED ORDER — INSULIN ASPART 100 UNIT/ML ~~LOC~~ SOLN
0.0000 [IU] | Freq: Three times a day (TID) | SUBCUTANEOUS | Status: DC
  Administered 2019-01-28 (×2): 3 [IU] via SUBCUTANEOUS
  Administered 2019-01-29: 2 [IU] via SUBCUTANEOUS

## 2019-01-28 NOTE — Progress Notes (Signed)
[facility] Progress Note  Patient: Randall Gill:[id number]:[id number]   PCP: Everlean CherryWhyte, [name], MD DOB: 1971-06-14   DOA: 01/24/2019   DOS: 01/28/2019   Date of Service: the patient was seen and examined on 01/28/2019  Brief hospital course: Pt. with PMH HTN, bradycardia, HLD, OSA; admitted on 01/24/2019, presented with complaint of cough and shortness of breath, was found to have COVID-19 pneumonia. Currently further plan is care.  Subjective: Reports cough.  No shortness of breath.  No nausea no vomiting.  No fever no chills.  Still feeling tired and fatigued.  Assessment and Plan: 1.  Acute COVID-19 Viral illness Lab Results  Component Value Date   SARSCOV2NAA POSITIVE (A) 01/25/2019   RVP not done CXR: hazy bilateral peripheral opacities  Recent Labs    01/26/19 0230 01/27/19 0220 01/28/19 0323  DDIMER <0.27 <0.27 <0.27  FERRITIN 315 314 287  CRP 1.3* <0.8 <0.8    Tmax: Afebrile Oxygen requirements: 2 LPM  Antibiotics: Not indicated Diuretics: none Vitamin C and Zinc: Continue DVT Prophylaxis: Subcutaneous Lovenox Remdesivir: Currently on day 5 on 01/28/2019. Steroids: Currently on day 5, 40 mg 3 times daily  Actemra: No indication for off-label use of Actemra:  Prone positioning: Patient encouraged to stay in prone position as much as possible.  During this encounter: Patient Isolation: Droplet + Contact HCP PPE: CAPR, gown. gloves Patient PPE: None  The treatment plan and use of medications and known side effects were discussed with patient/family. It was clearly explained that there is no proven definitive treatment for COVID-19 infection yet. Any medications used here are based on case reports/anecdotal data which are not peer-reviewed and has not been studied using randomized control trials.  Complete risks and long-term side effects are unknown, however in the best clinical judgment they seem to be of some clinical benefit rather than medical risks.   Patient/family agree with the treatment plan and want to receive these treatments as indicated.  2.  Obstructive sleep apnea. Currently on oxygen on high flow nasal cannula. Continue CPAP on discharge  3.  Essential hypertension. Blood pressure well controlled without any medication. Monitor.  4. Sinus bradycardia. Asymptomatic. Patient has few episodes of bradycardia at home likely in the setting of the same. Monitor. Outpatient follow-up with PCP.  5.  Abnormal EKG. Patient has abnormal EKG changes.  On admission. No elevation of ST segment. No elevation of troponin. Patient did not have any chest pain at work. I do not think that the patient suffered from inferior MI.  Diet: Cardiac diet DVT Prophylaxis: Subcutaneous Lovenox  Advance goals of care discussion: Full code  Family Communication: no family was present at bedside, at the time of interview.   Disposition:  Discharge to Home likely tomorrow.  Consultants: none Procedures: none  Scheduled Meds:  aspirin EC  81 mg Oral Daily   enoxaparin (LOVENOX) injection  40 mg Subcutaneous Q24H   folic acid  1 mg Oral Daily   insulin aspart  0-15 Units Subcutaneous TID WC   Ipratropium-Albuterol  1 puff Inhalation Q6H   [START ON 01/29/2019] methylPREDNISolone (SOLU-MEDROL) injection  40 mg Intravenous Daily   thiamine  100 mg Oral Daily   vitamin C  500 mg Oral Daily   zinc sulfate  220 mg Oral Daily   Continuous Infusions:  remdesivir 100 mg in NS 250 mL 100 mg (01/27/19 1748)   PRN Meds: acetaminophen, chlorpheniramine-HYDROcodone, guaiFENesin-dextromethorphan, menthol-cetylpyridinium, oxyCODONE, phenol, senna-docusate, zolpidem Antibiotics: Anti-infectives (From admission, onward)   Start  Dose/Rate Route Frequency Ordered Stop   01/25/19 1730  remdesivir 100 mg in sodium chloride 0.9 % 250 mL IVPB     100 mg 500 mL/hr over 30 Minutes Intravenous Every 24 hours 01/24/19 1632 01/29/19 1729    01/24/19 1730  remdesivir 200 mg in sodium chloride 0.9 % 250 mL IVPB     200 mg 500 mL/hr over 30 Minutes Intravenous Once 01/24/19 1632 01/24/19 1830       Objective: Physical Exam: Vitals:   01/27/19 1900 01/27/19 1946 01/27/19 2000 01/28/19 0300  BP: 125/77  112/80 120/81  Pulse: (!) 59  (!) 57   Resp: (!) 31  (!) 28   Temp:  98.5 F (36.9 C)  98.3 F (36.8 C)  TempSrc:    Oral  SpO2: 93%  93%   Weight:      Height:        Intake/Output Summary (Last 24 hours) at 01/28/2019 1520 Last data filed at 01/28/2019 1239 Gross per 24 hour  Intake 2027.67 ml  Output 2550 ml  Net -522.33 ml   Filed Weights   01/24/19 1728 01/24/19 1748  Weight: 77.5 kg 77.5 kg   General: alert and oriented to time, place, and person. Appear in mild distress, affect appropriate Eyes: PERRL, Conjunctiva normal ENT: Oral Mucosa Clear, moist  Neck: no JVD, no Abnormal Mass Or lumps Cardiovascular: S1 and S2 Present, no Murmur, peripheral pulses symmetrical Respiratory: normal respiratory effort, Bilateral Air entry equal and Decreased, no use of accessory muscle, bilateral  Crackles, no wheezes Abdomen: Bowel Sound present, Soft and no tenderness, no hernia Skin: no rashes  Extremities: no Pedal edema, no calf tenderness Neurologic: normal without focal findings, mental status, speech normal, alert and oriented x3, PERLA, Motor strength 5/5 and symmetric and sensation grossly normal to light touch Gait not checked due to patient safety concerns  Data Reviewed: CBC: Recent Labs  Lab 01/24/19 1508 01/25/19 0210 01/26/19 0230 01/27/19 0220 01/28/19 0323  WBC 7.7 10.9* 13.4* 14.3* 14.3*  NEUTROABS 6.2 9.0* 11.3* 12.7* 12.0*  HGB 15.9   16.2 14.2 14.4 14.7 14.9  HCT 46.2 42.7 42.2 42.7 43.9  MCV 84.5 85.4 85.6 84.9 84.7  PLT 310 323 334 380 016*   Basic Metabolic Panel: Recent Labs  Lab 01/24/19 1508 01/25/19 0210 01/25/19 1105 01/26/19 0230 01/27/19 0220 01/28/19 0323  NA 138  --   136 136 137 138  K 4.1  --  4.0 4.4 4.0 4.1  CL 100  --  102 105 105 105  CO2 25  --  25 22 22 24   GLUCOSE 131*  --  124* 136* 116* 126*  BUN 20  --  25* 24* 21* 20  CREATININE 0.66  --  0.67 0.58* 0.66 0.72  CALCIUM 9.0  --  8.3* 8.2* 7.9* 8.1*  MG 2.5* 2.3  --  2.3 2.3 2.3  PHOS  --  3.5  --  3.2 3.5 3.7    Liver Function Tests: Recent Labs  Lab 01/24/19 1508 01/25/19 1105 01/26/19 0230 01/27/19 0220 01/28/19 0323  AST 43* 59* 42* 41 45*  ALT 55* 100* 96* 94* 125*  ALKPHOS 53 50 49 49 53  BILITOT 0.8 0.5 0.4 0.3 0.4  PROT 7.4 6.3* 6.1* 5.9* 5.8*  ALBUMIN 3.4* 3.0* 2.9* 2.9* 2.9*   No results for input(s): LIPASE, AMYLASE in the last 168 hours. No results for input(s): AMMONIA in the last 168 hours. Coagulation Profile: No results for input(s): INR,  PROTIME in the last 168 hours. Cardiac Enzymes: Recent Labs  Lab 01/25/19 0210 01/26/19 0230 01/27/19 0220 01/28/19 0323  CKTOTAL 81 57 44* 28*   BNP (last 3 results) No results for input(s): PROBNP in the last 8760 hours. CBG: Recent Labs  Lab 01/27/19 1942 01/28/19 0007 01/28/19 0322 01/28/19 0833 01/28/19 1158  GLUCAP 205* 169* 141* 89 171*   Studies: No results found.   Time spent: 35 minutes  Author: Lynden OxfordPranav Jarrin Staley, MD [facility] 01/28/2019 3:20 PM  To reach On-call, see care teams to locate the attending and reach out to them via [email or web address]. If 7PM-7AM, please contact night-coverage If you still have difficulty reaching the attending provider, please page the [facility] (Director on Call) for [facility] on amion for assistance.

## 2019-01-28 NOTE — Progress Notes (Signed)
Patient ambulated on room air, maintained sats >95%

## 2019-01-29 DIAGNOSIS — R001 Bradycardia, unspecified: Secondary | ICD-10-CM

## 2019-01-29 DIAGNOSIS — M150 Primary generalized (osteo)arthritis: Secondary | ICD-10-CM

## 2019-01-29 DIAGNOSIS — U071 COVID-19: Principal | ICD-10-CM

## 2019-01-29 DIAGNOSIS — J1289 Other viral pneumonia: Secondary | ICD-10-CM

## 2019-01-29 DIAGNOSIS — I10 Essential (primary) hypertension: Secondary | ICD-10-CM

## 2019-01-29 DIAGNOSIS — G4733 Obstructive sleep apnea (adult) (pediatric): Secondary | ICD-10-CM

## 2019-01-29 DIAGNOSIS — E7800 Pure hypercholesterolemia, unspecified: Secondary | ICD-10-CM

## 2019-01-29 LAB — COMPREHENSIVE METABOLIC PANEL
ALT: 104 U/L — ABNORMAL HIGH (ref 0–44)
AST: 26 U/L (ref 15–41)
Albumin: 3 g/dL — ABNORMAL LOW (ref 3.5–5.0)
Alkaline Phosphatase: 52 U/L (ref 38–126)
Anion gap: 11 (ref 5–15)
BUN: 24 mg/dL — ABNORMAL HIGH (ref 6–20)
CO2: 23 mmol/L (ref 22–32)
Calcium: 8.4 mg/dL — ABNORMAL LOW (ref 8.9–10.3)
Chloride: 104 mmol/L (ref 98–111)
Creatinine, Ser: 0.79 mg/dL (ref 0.61–1.24)
GFR calc Af Amer: >60 mL/min (ref >60)
GFR calc non Af Amer: >60 mL/min (ref >60)
Glucose, Bld: 88 mg/dL (ref 70–99)
Potassium: 3.8 mmol/L (ref 3.5–5.1)
Sodium: 138 mmol/L (ref 135–145)
Total Bilirubin: 0.3 mg/dL (ref 0.3–1.2)
Total Protein: 5.7 g/dL — ABNORMAL LOW (ref 6.5–8.1)

## 2019-01-29 LAB — CBC WITH DIFFERENTIAL/PLATELET
Abs Immature Granulocytes: 1.7 10*3/uL — ABNORMAL HIGH (ref 0.00–0.07)
Basophils Absolute: 0 10*3/uL (ref 0.0–0.1)
Basophils Relative: 0 %
Eosinophils Absolute: 0 10*3/uL (ref 0.0–0.5)
Eosinophils Relative: 0 %
HCT: 45.2 % (ref 39.0–52.0)
Hemoglobin: 15.4 g/dL (ref 13.0–17.0)
Lymphocytes Relative: 9 %
Lymphs Abs: 1.5 10*3/uL (ref 0.7–4.0)
MCH: 29.1 pg (ref 26.0–34.0)
MCHC: 34.1 g/dL (ref 30.0–36.0)
MCV: 85.3 fL (ref 80.0–100.0)
Metamyelocytes Relative: 2 %
Monocytes Absolute: 0.8 10*3/uL (ref 0.1–1.0)
Monocytes Relative: 5 %
Myelocytes: 8 %
Neutro Abs: 12.5 10*3/uL — ABNORMAL HIGH (ref 1.7–7.7)
Neutrophils Relative %: 76 %
Platelets: 439 10*3/uL — ABNORMAL HIGH (ref 150–400)
RBC: 5.3 MIL/uL (ref 4.22–5.81)
RDW: 11.9 % (ref 11.5–15.5)
WBC: 16.5 10*3/uL — ABNORMAL HIGH (ref 4.0–10.5)
nRBC: 0.4 % — ABNORMAL HIGH (ref 0.0–0.2)

## 2019-01-29 LAB — MAGNESIUM: Magnesium: 2.3 mg/dL (ref 1.7–2.4)

## 2019-01-29 LAB — GLUCOSE, CAPILLARY
Glucose-Capillary: 87 mg/dL (ref 70–99)
Glucose-Capillary: 131 mg/dL — ABNORMAL HIGH (ref 70–99)
Glucose-Capillary: 166 mg/dL — ABNORMAL HIGH (ref 70–99)

## 2019-01-29 LAB — PHOSPHORUS: Phosphorus: 3.8 mg/dL (ref 2.5–4.6)

## 2019-01-29 LAB — CULTURE, BLOOD (ROUTINE X 2)
Culture: NO GROWTH
Culture: NO GROWTH
Special Requests: ADEQUATE
Special Requests: ADEQUATE

## 2019-01-29 LAB — D-DIMER, QUANTITATIVE: D-Dimer, Quant: <0.27 ug/mL-FEU (ref 0.00–0.50)

## 2019-01-29 LAB — FERRITIN: Ferritin: 287 ng/mL (ref 24–336)

## 2019-01-29 LAB — CK: Total CK: 32 U/L — ABNORMAL LOW (ref 49–397)

## 2019-01-29 LAB — C-REACTIVE PROTEIN: CRP: 0.8 mg/dL (ref <1.0)

## 2019-01-29 MED ORDER — PREDNISONE 10 MG PO TABS: ORAL_TABLET | ORAL | 0 refills | Status: AC

## 2019-01-29 NOTE — Discharge Summary (Signed)
Physician Discharge Summary  Randall Gill GQQ:[id number] DOB: 1970-12-31 DOA: 01/24/2019  PCP: Maris Berger, MD  Admit date: 01/24/2019 Discharge date: 01/29/2019  Admitted From: Home Disposition: Home  Recommendations for Outpatient Follow-up:  1. Follow up with PCP in 1-2 weeks 2. Please obtain BMP/CBC in one week  Discharge Condition: Stable CODE STATUS: Full Diet recommendation: As tolerated  Brief/Interim Summary: Pt. with PMH HTN, bradycardia, HLD, OSA; admitted on 01/24/2019, presented with complaint of cough and shortness of breath, was found to have COVID-19 pneumonia. Currently further plan is care.  Patient admitted as above with worsening cough shortness of breath noted to have COVID-19 positive labs and pneumonia on imaging with notable hypoxia requiring 2 L nasal cannula initially to maintain normal O2 sats.  Now resolving quite drastically, patient's inflammatory markers were quite low but continue to improve, patient no longer requiring oxygen, currently tolerating steroids quite well completed Remdesivir and essentially back to baseline.  Patient otherwise stable and agreeable for discharge home, close follow-up with PCP in 1 to 2 weeks as scheduled.  Discharge Diagnoses:  Active Problems:   Pneumonia due to COVID-19 virus   Essential hypertension   Bradycardia   HLD (hyperlipidemia)   OSA (obstructive sleep apnea)   Obese   OA (osteoarthritis)  Discharge Instructions Continue take all medications as prescribed, continue to follow closely with PCP in 1 to 2 weeks as scheduled.  If you have worsening symptoms, dyspnea with exertion, fever, chills, general weakness, please see PCP for further evaluation and treatment.  Allergies as of 01/29/2019   No Known Allergies     Medication List    STOP taking these medications   benzonatate 100 MG capsule Commonly known as: TESSALON   doxycycline 100 MG capsule Commonly known as: VIBRAMYCIN     TAKE these  medications   acetaminophen 500 MG tablet Commonly known as: TYLENOL Take 1,000 mg by mouth every 6 (six) hours as needed for mild pain.   chlorpheniramine-HYDROcodone 10-8 MG/5ML Suer Commonly known as: TUSSIONEX Take 5 mLs by mouth every 12 (twelve) hours as needed for cough. Take 81mL by mouth every 12 hours as needed for cough for 12 days.   predniSONE 10 MG tablet Commonly known as: DELTASONE Take 4 tablets (40 mg total) by mouth daily for 3 days, THEN 3 tablets (30 mg total) daily for 3 days, THEN 2 tablets (20 mg total) daily for 3 days, THEN 1 tablet (10 mg total) daily for 3 days. Start taking on: January 29, 2019      Procedures/Studies: Portable Chest 1 View  Result Date: 01/24/2019 CLINICAL DATA:  Shortness of breath, history of code positivity EXAM: PORTABLE CHEST 1 VIEW COMPARISON:  01/20/2019 FINDINGS: Cardiac shadow is stable. Lungs are hypoinflated. Patchy airspace opacities are noted bilaterally consistent with the given clinical history. The overall appearance is slightly progressed in the right base but otherwise stable. No bony abnormality is noted. IMPRESSION: Patchy infiltrates throughout both lungs slightly worse in the right lung base consistent with the patient's given clinical history. Electronically Signed   By: Inez Catalina M.D.   On: 01/24/2019 15:37   Dg Chest Portable 1 View  Result Date: 01/20/2019 CLINICAL DATA:  Shortness of breath EXAM: PORTABLE CHEST 1 VIEW COMPARISON:  May 20, 2015 FINDINGS: There is subtle ill-defined opacity in the left lower lobe. Lungs elsewhere are clear. Heart is upper normal in size with pulmonary vascularity normal. No adenopathy. No bone lesions. IMPRESSION: Subtle ill-defined opacity left lower lobe,  concerning for early pneumonia. Lungs elsewhere clear. Heart upper normal in size. No evident adenopathy. Electronically Signed   By: Bretta BangWilliam  Woodruff III M.D.   On: 01/20/2019 15:27    Subjective: No acute issues or events  overnight, tolerating p.o. well, declines shortness of breath, dyspnea with exertion, chest pain, fevers, chills, nausea, vomiting, diarrhea, constipation. Now completed Remdesivir course, stable and otherwise agreeable for discharge as above.  Discharge Exam: Vitals:   01/29/19 0400 01/29/19 0735  BP:  101/67  Pulse: (!) 42 (!) 50  Resp: 15 20  Temp:  97.9 F (36.6 C)  SpO2: 94% 93%   Vitals:   01/29/19 0000 01/29/19 0300 01/29/19 0400 01/29/19 0735  BP: (!) 108/50   101/67  Pulse: (!) 44 (!) 46 (!) 42 (!) 50  Resp: (!) 25 (!) 23 15 20   Temp:    97.9 F (36.6 C)  TempSrc:    Oral  SpO2: 96% 92% 94% 93%  Weight:      Height:        General:  Pleasantly resting in bed, No acute distress. HEENT:  Normocephalic atraumatic.  Sclerae nonicteric, noninjected.  Extraocular movements intact bilaterally. Neck:  Without mass or deformity.  Trachea is midline. Lungs:  Clear to auscultate bilaterally without rhonchi, wheeze, or rales. Heart:  Regular rate and rhythm.  Without murmurs, rubs, or gallops. Abdomen:  Soft, nontender, nondistended.  Without guarding or rebound. Extremities: Without cyanosis, clubbing, edema, or obvious deformity. Vascular:  Dorsalis pedis and posterior tibial pulses palpable bilaterally. Skin:  Warm and dry, no erythema, no ulcerations.   The results of significant diagnostics from this hospitalization (including imaging, microbiology, ancillary and laboratory) are listed below for reference.     Microbiology: Recent Results (from the past 240 hour(s))  Culture, blood (Routine X 2) w Reflex to ID Panel     Status: None   Collection Time: 01/24/19  3:08 PM   Specimen: BLOOD  Result Value Ref Range Status   Specimen Description   Final    BLOOD LEFT ANTECUBITAL Performed at [facility], [address] [place], [place] [address]    Special Requests   Final    BOTTLES DRAWN AEROBIC ONLY Blood Culture adequate volume Performed at  [facility], [address] [place], [place] [address]    Culture   Final    NO GROWTH 5 DAYS Performed at [facility], [address] [place], [place] [address]    Report Status 01/29/2019 FINAL  Final  Culture, blood (Routine X 2) w Reflex to ID Panel     Status: None   Collection Time: 01/24/19  3:12 PM   Specimen: BLOOD  Result Value Ref Range Status   Specimen Description   Final    BLOOD RIGHT HAND Performed at [facility], [address] [place], [place] [address]    Special Requests   Final    BOTTLES DRAWN AEROBIC ONLY Blood Culture adequate volume Performed at [facility], [address] [place] [address] [place], [place] [address]    Culture   Final    NO GROWTH 5 DAYS Performed at [facility], [address] [place], [place] [address]    Report Status 01/29/2019 FINAL  Final  SARS Coronavirus 2 [facility](Hospital order, Performed in [facility] lab)     Status: Abnormal   Collection Time: 01/25/19  7:00 AM   Specimen: Nasopharyngeal Swab  Result Value Ref Range  Status   SARS Coronavirus 2 POSITIVE (A) NEGATIVE Final    Comment: RESULT CALLED TO, READ BACK BY AND VERIFIED WITH: C CARPENTER,RN 01/25/19 1320 RHOLMES (NOTE) If result is NEGATIVE SARS-CoV-2 target nucleic acids are NOT DETECTED. The SARS-CoV-2 RNA is generally detectable in upper and lower  respiratory specimens during the acute phase of infection. The lowest  concentration of SARS-CoV-2 viral copies this assay can detect is 250  copies / mL. A negative result does not preclude SARS-CoV-2 infection  and should not be used as the sole basis for treatment or other  patient management decisions.  A negative result may occur with  improper specimen collection / handling, submission of specimen other  than nasopharyngeal swab, presence of viral mutation(s) within the  areas targeted by this assay, and inadequate number of viral copies  (<250  copies / mL). A negative result must be combined with clinical  observations, patient history, and epidemiological information. If result is POSITIVE SARS-CoV-2 target nucleic acids are DETECTED. T he SARS-CoV-2 RNA is generally detectable in upper and lower  respiratory specimens during the acute phase of infection.  Positive  results are indicative of active infection with SARS-CoV-2.  Clinical  correlation with patient history and other diagnostic information is  necessary to determine patient infection status.  Positive results do  not rule out bacterial infection or co-infection with other viruses. If result is PRESUMPTIVE POSTIVE SARS-CoV-2 nucleic acids MAY BE PRESENT.   A presumptive positive result was obtained on the submitted specimen  and confirmed on repeat testing.  While 2019 novel coronavirus  (SARS-CoV-2) nucleic acids may be present in the submitted sample  additional confirmatory testing may be necessary for epidemiological  and / or clinical management purposes  to differentiate between  SARS-CoV-2 and other Sarbecovirus currently known to infect humans.  If clinically indicated additional testing with an alternate test  methodology [phone]([id number]) is  advised. The SARS-CoV-2 RNA is generally  detectable in upper and lower respiratory specimens during the acute  phase of infection. The expected result is Negative. Fact Sheet for Patients:  BoilerBrush.com.cyhttps://www.fda.gov/media/136312/download Fact Sheet for Healthcare Providers: https://pope.com/https://www.fda.gov/media/136313/download This test is not yet approved or cleared by the [place] FDA and has been authorized for detection and/or diagnosis of SARS-CoV-2 by FDA under an Emergency Use Authorization (EUA).  This EUA will remain in effect (meaning this test can be used) for the duration of the COVID-19 declaration under Section 564(b)(1) of the Act, 21 U.S.C. section 360bbb-3(b)(1), unless the authorization is terminated or revoked  sooner. Performed at [facility], [address] [place], [place] [address]      Labs: BNP (last 3 results) Recent Labs    01/24/19 1508  BNP 28.8   Basic Metabolic Panel: Recent Labs  Lab 01/25/19 0210 01/25/19 1105 01/26/19 0230 01/27/19 0220 01/28/19 0323 01/29/19 0230  NA  --  136 136 137 138 138  K  --  4.0 4.4 4.0 4.1 3.8  CL  --  102 105 105 105 104  CO2  --  25 22 22 24 23   GLUCOSE  --  124* 136* 116* 126* 88  BUN  --  25* 24* 21* 20 24*  CREATININE  --  0.67 0.58* 0.66 0.72 0.79  CALCIUM  --  8.3* 8.2* 7.9* 8.1* 8.4*  MG 2.3  --  2.3 2.3 2.3 2.3  PHOS 3.5  --  3.2 3.5 3.7 3.8   Liver Function Tests: Recent Labs  Lab 01/25/19  1105 01/26/19 0230 01/27/19 0220 01/28/19 0323 01/29/19 0230  AST 59* 42* 41 45* 26  ALT 100* 96* 94* 125* 104*  ALKPHOS 50 49 49 53 52  BILITOT 0.5 0.4 0.3 0.4 0.3  PROT 6.3* 6.1* 5.9* 5.8* 5.7*  ALBUMIN 3.0* 2.9* 2.9* 2.9* 3.0*   No results for input(s): LIPASE, AMYLASE in the last 168 hours. No results for input(s): AMMONIA in the last 168 hours. CBC: Recent Labs  Lab 01/25/19 0210 01/26/19 0230 01/27/19 0220 01/28/19 0323 01/29/19 0230  WBC 10.9* 13.4* 14.3* 14.3* 16.5*  NEUTROABS 9.0* 11.3* 12.7* 12.0* 12.5*  HGB 14.2 14.4 14.7 14.9 15.4  HCT 42.7 42.2 42.7 43.9 45.2  MCV 85.4 85.6 84.9 84.7 85.3  PLT 323 334 380 416* 439*   Cardiac Enzymes: Recent Labs  Lab 01/25/19 0210 01/26/19 0230 01/27/19 0220 01/28/19 0323 01/29/19 0230  CKTOTAL 81 57 44* 28* 32*   BNP: Invalid input(s): POCBNP CBG: Recent Labs  Lab 01/28/19 0833 01/28/19 1158 01/28/19 1609 01/29/19 0734 01/29/19 1141  GLUCAP 89 171* 160* 87 131*   D-Dimer Recent Labs    01/28/19 0323 01/29/19 0230  DDIMER <0.27 <0.27   Hgb A1c No results for input(s): HGBA1C in the last 72 hours. Lipid Profile No results for input(s): CHOL, HDL, LDLCALC, TRIG, CHOLHDL, LDLDIRECT in the last 72 hours. Thyroid function  studies No results for input(s): TSH, T4TOTAL, T3FREE, THYROIDAB in the last 72 hours.  Invalid input(s): FREET3 Anemia work up Recent Labs    01/28/19 0323 01/29/19 0230  FERRITIN 287 287   Urinalysis No results found for: COLORURINE, APPEARANCEUR, LABSPEC, PHURINE, GLUCOSEU, HGBUR, BILIRUBINUR, KETONESUR, PROTEINUR, UROBILINOGEN, NITRITE, LEUKOCYTESUR Sepsis Labs Invalid input(s): PROCALCITONIN,  WBC,  LACTICIDVEN Microbiology Recent Results (from the past 240 hour(s))  Culture, blood (Routine X 2) w Reflex to ID Panel     Status: None   Collection Time: 01/24/19  3:08 PM   Specimen: BLOOD  Result Value Ref Range Status   Specimen Description   Final    BLOOD LEFT ANTECUBITAL Performed at [facility], [address] [place], [place] [address]    Special Requests   Final    BOTTLES DRAWN AEROBIC ONLY Blood Culture adequate volume Performed at [facility], [address] [place], [place] [address]    Culture   Final    NO GROWTH 5 DAYS Performed at [facility], [address] [place], [place] [address]    Report Status 01/29/2019 FINAL  Final  Culture, blood (Routine X 2) w Reflex to ID Panel     Status: None   Collection Time: 01/24/19  3:12 PM   Specimen: BLOOD  Result Value Ref Range Status   Specimen Description   Final    BLOOD RIGHT HAND Performed at [facility], [address] [place], [place] [address]    Special Requests   Final    BOTTLES DRAWN AEROBIC ONLY Blood Culture adequate volume Performed at [facility], [address] [place], [place] [address]    Culture   Final    NO GROWTH 5 DAYS Performed at [facility], [address] [place], [place] [address]    Report Status 01/29/2019 FINAL  Final  SARS Coronavirus 2 [facility](Hospital order, Performed in [facility] lab)     Status: Abnormal   Collection Time: 01/25/19  7:00 AM   Specimen: Nasopharyngeal  Swab  Result Value Ref Range Status  SARS Coronavirus 2 POSITIVE (A) NEGATIVE Final    Comment: RESULT CALLED TO, READ BACK BY AND VERIFIED WITH: C CARPENTER,RN 01/25/19 1320 RHOLMES (NOTE) If result is NEGATIVE SARS-CoV-2 target nucleic acids are NOT DETECTED. The SARS-CoV-2 RNA is generally detectable in upper and lower  respiratory specimens during the acute phase of infection. The lowest  concentration of SARS-CoV-2 viral copies this assay can detect is 250  copies / mL. A negative result does not preclude SARS-CoV-2 infection  and should not be used as the sole basis for treatment or other  patient management decisions.  A negative result may occur with  improper specimen collection / handling, submission of specimen other  than nasopharyngeal swab, presence of viral mutation(s) within the  areas targeted by this assay, and inadequate number of viral copies  (<250 copies / mL). A negative result must be combined with clinical  observations, patient history, and epidemiological information. If result is POSITIVE SARS-CoV-2 target nucleic acids are DETECTED. T he SARS-CoV-2 RNA is generally detectable in upper and lower  respiratory specimens during the acute phase of infection.  Positive  results are indicative of active infection with SARS-CoV-2.  Clinical  correlation with patient history and other diagnostic information is  necessary to determine patient infection status.  Positive results do  not rule out bacterial infection or co-infection with other viruses. If result is PRESUMPTIVE POSTIVE SARS-CoV-2 nucleic acids MAY BE PRESENT.   A presumptive positive result was obtained on the submitted specimen  and confirmed on repeat testing.  While 2019 novel coronavirus  (SARS-CoV-2) nucleic acids may be present in the submitted sample  additional confirmatory testing may be necessary for epidemiological  and / or clinical management purposes  to differentiate between   SARS-CoV-2 and other Sarbecovirus currently known to infect humans.  If clinically indicated additional testing with an alternate test  methodology [phone]) is  advised. The SARS-CoV-2 RNA is generally  detectable in upper and lower respiratory specimens during the acute  phase of infection. The expected result is Negative. Fact Sheet for Patients:  BoilerBrush.com.cy Fact Sheet for Healthcare Providers: https://pope.com/ This test is not yet approved or cleared by the [place] FDA and has been authorized for detection and/or diagnosis of SARS-CoV-2 by FDA under an Emergency Use Authorization (EUA).  This EUA will remain in effect (meaning this test can be used) for the duration of the COVID-19 declaration under Section 564(b)(1) of the Act, 21 U.S.C. section 360bbb-3(b)(1), unless the authorization is terminated or revoked sooner. Performed at [facility], [address], [place], [place] [address]    Time coordinating discharge: Over 30 minutes  SIGNED: Azucena Fallen, DO [facility] 01/29/2019, 2:43 PM Pager   If 7PM-7AM, please contact night-coverage [email or web address] Password [email or web address]

## 2019-01-29 NOTE — Progress Notes (Signed)
Reviewed all d/c instructions with patient and copy given.  Verbalized understanding.  Reviewed Health and Human Services Covid form and patient signed.  Placed in chart.  Dr. Avon Gully notified patient needs employer note.  Will d/c via [place] once wife arrives.

## 2020-09-18 ENCOUNTER — Emergency Department (HOSPITAL_COMMUNITY): Payer: Commercial Managed Care - PPO

## 2020-09-18 ENCOUNTER — Encounter (HOSPITAL_COMMUNITY): Payer: Self-pay | Admitting: Pharmacy Technician

## 2020-09-18 ENCOUNTER — Emergency Department (HOSPITAL_COMMUNITY)
Admission: EM | Admit: 2020-09-18 | Discharge: 2020-09-18 | Disposition: A | Discharge status: Home / Self Care | Payer: Commercial Managed Care - PPO | Attending: Emergency Medicine | Admitting: Emergency Medicine

## 2020-09-18 ENCOUNTER — Other Ambulatory Visit: Payer: Self-pay

## 2020-09-18 DIAGNOSIS — Z8616 Personal history of COVID-19: Secondary | ICD-10-CM | POA: Insufficient documentation

## 2020-09-18 DIAGNOSIS — Y9366 Activity, soccer: Secondary | ICD-10-CM | POA: Diagnosis not present

## 2020-09-18 DIAGNOSIS — W1830XA Fall on same level, unspecified, initial encounter: Secondary | ICD-10-CM | POA: Insufficient documentation

## 2020-09-18 DIAGNOSIS — S6992XA Unspecified injury of left wrist, hand and finger(s), initial encounter: Secondary | ICD-10-CM | POA: Diagnosis present

## 2020-09-18 DIAGNOSIS — S63105A Unspecified dislocation of left thumb, initial encounter: Secondary | ICD-10-CM | POA: Diagnosis not present

## 2020-09-18 DIAGNOSIS — I10 Essential (primary) hypertension: Secondary | ICD-10-CM | POA: Diagnosis not present

## 2020-09-18 MED ORDER — LIDOCAINE HCL (PF) 1 % IJ SOLN
30.0000 mL | Freq: Once | INTRAMUSCULAR | Status: DC
  Filled 2020-09-18: qty 30

## 2020-09-18 MED ORDER — PROPOFOL 10 MG/ML IV BOLUS: 1.0000 mg/kg | Freq: Once | INTRAVENOUS | Status: DC

## 2020-09-18 MED ORDER — FENTANYL CITRATE (PF) 100 MCG/2ML IJ SOLN: 50.0000 ug | Freq: Once | INTRAMUSCULAR | Status: DC

## 2020-09-18 MED ORDER — OXYCODONE-ACETAMINOPHEN 5-325 MG PO TABS
1.0000 | ORAL_TABLET | Freq: Once | ORAL | Status: AC
  Administered 2020-09-18: 1 via ORAL
  Filled 2020-09-18: qty 1

## 2020-09-18 NOTE — ED Provider Notes (Signed)
[facility] EMERGENCY DEPARTMENT Provider Note   CSN: [id number] Arrival date & time: 09/18/20  1341     History Chief Complaint  Patient presents with  . Hand Pain    Randall Gill is a 50 y.o. male presents to the ED for evaluation of right thumb deformity that occurred earlier this morning while he was playing soccer.  He fell and landed on his hand but not exactly sure how he twisted it.  Constant local pain.  States 2 of his teammates tried to pull on it and they could not.  Denies fingertip tingling or coolness.  He is right-hand dominant. Last ate at 10 am. Has been drinking water.   HPI     History reviewed. No pertinent past medical history.  Patient Active Problem List   Diagnosis Date Noted  . Pneumonia due to COVID-19 virus 01/24/2019  . Essential hypertension 01/24/2019  . Bradycardia 01/24/2019  . HLD (hyperlipidemia) 01/24/2019  . OSA (obstructive sleep apnea) 01/24/2019  . Obese 01/24/2019  . OA (osteoarthritis) 01/24/2019    Past Surgical History:  Procedure Laterality Date  . EYE SURGERY  2018   lasix       Family History  Problem Relation Age of Onset  . Diabetes Mother   . Diabetes Father     Social History   Tobacco Use  . Smoking status: Never Smoker  . Smokeless tobacco: Never Used  Vaping Use  . Vaping Use: Never used  Substance Use Topics  . Alcohol use: Yes    Comment: socially    Home Medications Prior to Admission medications   Medication Sig Start Date End Date Taking? Authorizing Provider  acetaminophen (TYLENOL) 500 MG tablet Take 1,000 mg by mouth every 6 (six) hours as needed for mild pain.    [provider]  chlorpheniramine-HYDROcodone (TUSSIONEX) 10-8 MG/5ML SUER Take 5 mLs by mouth every 12 (twelve) hours as needed for cough. Take 82mL by mouth every 12 hours as needed for cough for 12 days. 01/22/19   [provider]    Allergies    Patient has no known allergies.  Review of  Systems   Review of Systems  Musculoskeletal: Positive for arthralgias.  All other systems reviewed and are negative.   Physical Exam Updated Vital Signs BP (!) 136/93 (BP Location: Right Arm)   Pulse (!) 104   Temp 99.2 F (37.3 C)   Resp 18   SpO2 96%   Physical Exam Constitutional:      Appearance: He is well-developed.  HENT:     Head: Normocephalic.     Nose: Nose normal.  Eyes:     General: Lids are normal.  Cardiovascular:     Rate and Rhythm: Normal rate.  Pulmonary:     Effort: Pulmonary effort is normal. No respiratory distress.  Musculoskeletal:        General: Normal range of motion.     Cervical back: Normal range of motion.     Comments: Left thumb with deformity at proximal IP, distal IP held in flexed position. Diffuse tenderness. No scaphoid tenderness. Full ROM of wrist without pain. Sensation to rub/scratch in distal finger tip intact. Cap refill normal  Neurological:     Mental Status: He is alert.  Psychiatric:        Behavior: Behavior normal.     ED Results / Procedures / Treatments   Labs (all labs ordered are listed, but only abnormal results are displayed) Labs Reviewed -  No data to display  EKG None  Radiology DG Hand Complete Left  Result Date: 09/18/2020 CLINICAL DATA:  Fall.  Deformity. EXAM: LEFT HAND - COMPLETE 3+ VIEW COMPARISON:  None. FINDINGS: Dislocation of the proximal phalanx of the thumb relative to the first metacarpal, radially and somewhat anteriorly. No complicating fracture. IMPRESSION: Dislocation of the first metacarpal phalangeal joint as detailed above. Electronically Signed   By: Jeronimo Greaves M.D.   On: 09/18/2020 14:37    Procedures Procedures   Medications Ordered in ED Medications  lidocaine (PF) (XYLOCAINE) 1 % injection 30 mL (has no administration in time range)  propofol (DIPRIVAN) 10 mg/mL bolus/IV push 77 mg (has no administration in time range)  oxyCODONE-acetaminophen (PERCOCET/ROXICET) 5-325 MG per  tablet 1 tablet (1 tablet Oral Given 09/18/20 1435)    ED Course  I have reviewed the triage vital signs and the nursing notes.  Pertinent labs & imaging results that were available during my care of the patient were reviewed by me and considered in my medical decision making (see chart for details).  Clinical Course as of 09/18/20 1634  Wynelle Link Sep 18, 2020  1558 50 yo male who is right handed presenting to ED with left thumb dislocation, fall on outstretched hand playing soccer today.  Xray shows proximal joint dislocation.  No fx.  No open wound.  Distal sensation is intact.  We attempted bedside reduction with direct traction countertraction after percocet, unsuccessful.  I consented the patient for propofol sedation, explaining the risks, benefits and alternatives, and we'll attempt a sedation reduction.  No hx of anesthesia complications or allergies.  Last meal 0900 this morning.   [MT]    Clinical Course User Index [MT] Trifan, Kermit Balo, MD   MDM Rules/Calculators/A&P                          50 year old with left thumb dislocation at first IP joint.  EDP/myself attempted reduction at bedside/Hall bed without success.  EDP spoke to Dr. Aundria Rud who recommends conscious sedation and reduction in the ED.  Patient care transferred to oncoming ED MD who will reattempt reduction and determine disposition.  Patient currently in hall bed, asked charge RN Asher Muir to prioritize room given dislocated since this morning.   Final Clinical Impression(s) / ED Diagnoses Final diagnoses:  Dislocation of left thumb, initial encounter    Rx / DC Orders ED Discharge Orders    None       Liberty Handy, PA-C 09/18/20 1634    Terald Sleeper, MD 09/18/20 1743

## 2020-09-18 NOTE — ED Notes (Signed)
DC instructions reviewed with the pt.  Pt verbalized understanding.  PT DC.  

## 2020-09-18 NOTE — Discharge Instructions (Addendum)
You were seen in the emergency department for her left thumb deformity  X-ray showed dislocation at the first joint  This was relocated  Wear your splint as instructed  Call orthopedist and make an appointment for reevaluation.  They may to repeat x-rays.  Ice, elevate.  Take ibuprofen and acetaminophen every 6-8 hours for pain

## 2020-09-18 NOTE — Progress Notes (Signed)
Orthopedic Tech Progress Note Patient Details:  Randall Gill 10/27/1970 [id number]  Ortho Devices Type of Ortho Device: Thumb spica splint Splint Material: Fiberglass Ortho Device/Splint Location: lue Ortho Device/Splint Interventions: Ordered,Application,Adjustment   Post Interventions Patient Tolerated: Well Instructions Provided: Care of device,Adjustment of device   Trinna Post 09/18/2020, 7:43 PM

## 2020-09-18 NOTE — ED Triage Notes (Signed)
Pt here with deformity to L thumb. Pt states he fell on that hand.

## 2020-09-18 NOTE — ED Notes (Signed)
Pt injured left thumb during soccer. Deformity noted.

## 2020-09-18 NOTE — ED Provider Notes (Addendum)
Signout note  Left thumb dislocation.  Dr. Renaye Rakers and Debarah Crape PA were unsuccessful in their initial attempt.  Reviewed with hand surgery recommended trying again with conscious sedation.  When I assessed patient, offered to reattempt finger after performing digital block.  Discussed benefits of conscious sedation versus reattempting without.  Patient wishes to proceed with an additional attempt prior to conscious sedation.  Single attempt was successful with assistance of [place] PA.  Patient tolerated well, neurovascularly intact.  Will check post reduction films to confirm.  Will place in thumb spica and recommend follow-up with hand surgery outpatient.  [facility] Injury Treatment  Date/Time: 09/18/2020 5:09 PM Performed by: Milagros Loll, MD Authorized by: Milagros Loll, MD   Consent:    Consent obtained:  Verbal   Consent given by:  Patient   Risks discussed:  Irreducible dislocation, fracture, recurrent dislocation, nerve damage, restricted joint movement and stiffness   Alternatives discussed:  No treatment, immobilization and referralInjury location: finger Location details: left thumb Injury type: dislocation Dislocation type: MCP Pre-procedure neurovascular assessment: neurovascularly intact Pre-procedure distal perfusion: normal Pre-procedure neurological function: normal Pre-procedure range of motion: normal Anesthesia: digital block  Anesthesia: Local anesthesia used: yes Local Anesthetic: lidocaine 1% without epinephrine Anesthetic total: 6 mL  Patient sedated: NoManipulation performed: yes Reduction successful: yes X-ray confirmed reduction: yes Immobilization: splint Splint type: thumb spica Post-procedure neurovascular assessment: post-procedure neurovascularly intact Post-procedure distal perfusion: normal Post-procedure neurological function: normal Post-procedure range of motion: normal       Milagros Loll, MD 09/18/20 1712    Milagros Loll, MD 09/18/20 1712

## 2021-05-05 IMAGING — DX PORTABLE CHEST - 1 VIEW
1 series · 1 of 1 positions shown · non-contrast
Comparison: 01/20/2019

CLINICAL DATA: Shortness of breath, history of code positivity

EXAM:
PORTABLE CHEST 1 VIEW

[chest]
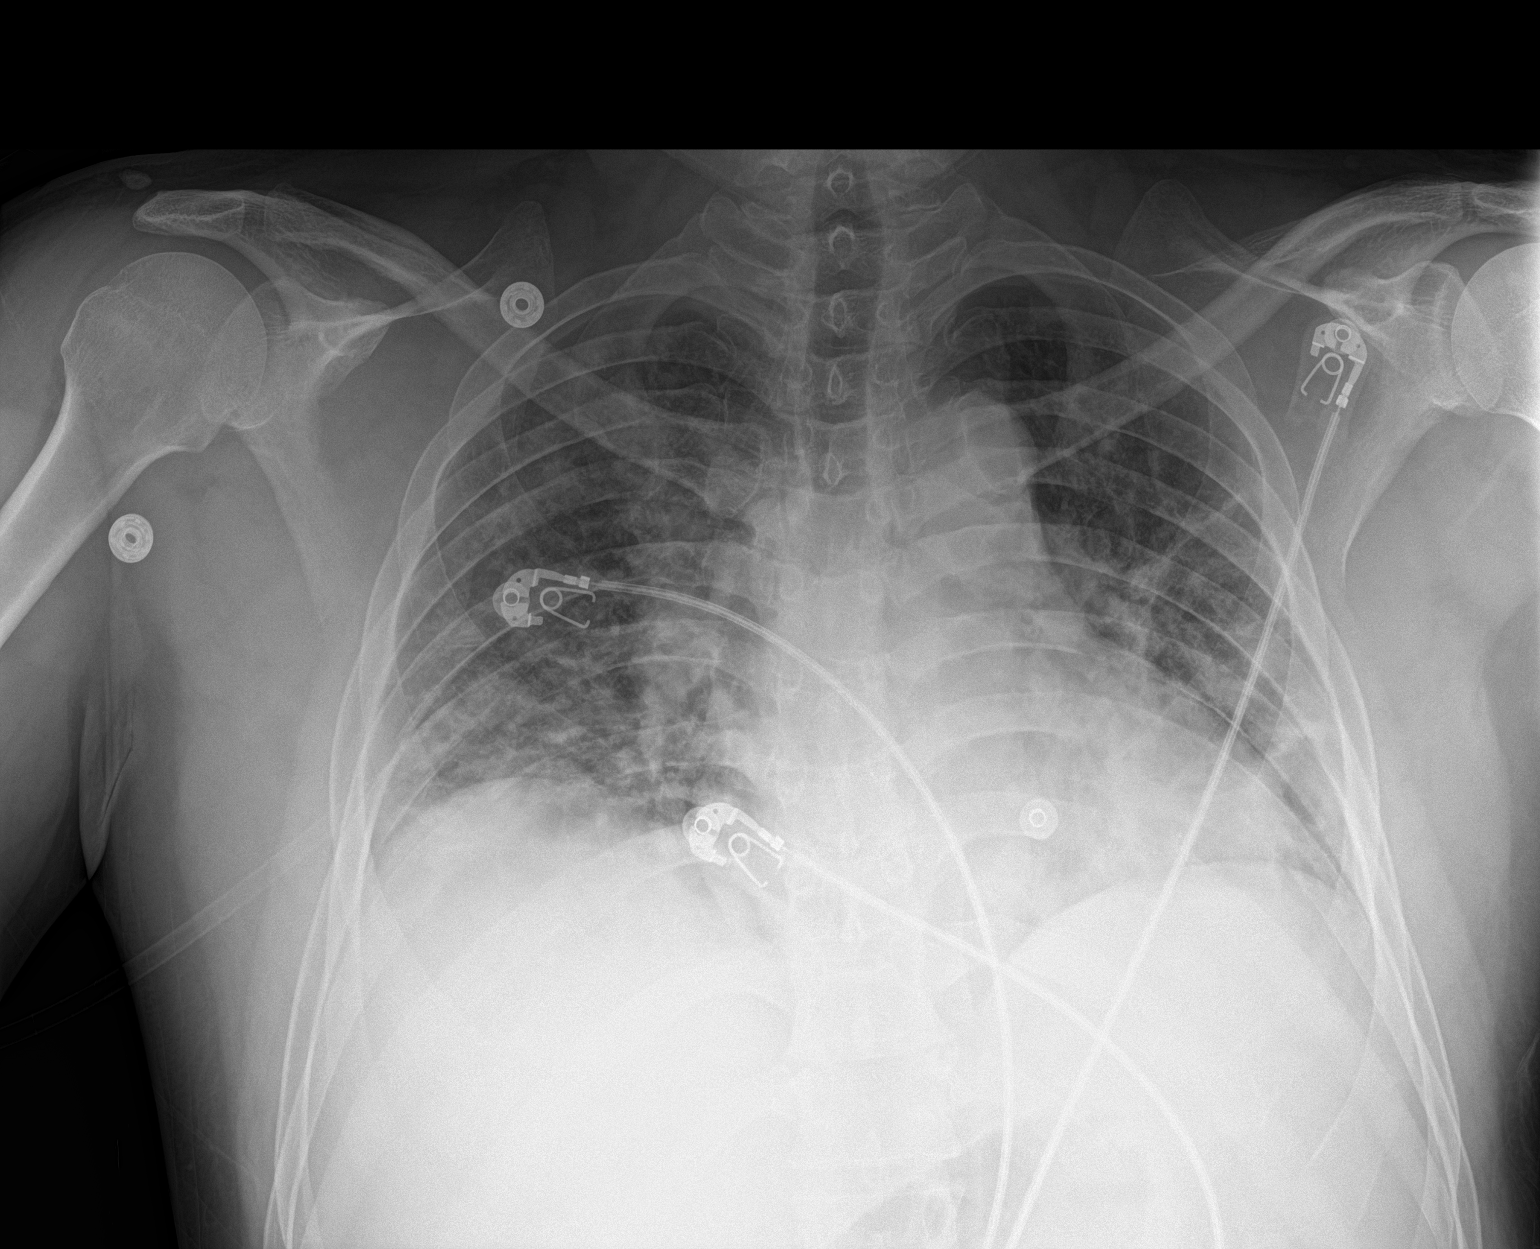

[1 of 1 positions shown; findings below may reference images not displayed]

FINDINGS: Cardiac shadow is stable. Lungs are hypoinflated. Patchy airspace
opacities are noted bilaterally consistent with the given clinical
history. The overall appearance is slightly progressed in the right
base but otherwise stable. No bony abnormality is noted.
IMPRESSION: Patchy infiltrates throughout both lungs slightly worse in the right
lung base consistent with the patient's given clinical history.

## 2022-12-29 IMAGING — CR DG HAND COMPLETE 3+V*L*
3 series · 3 of 3 positions shown · non-contrast
Comparison: None.

CLINICAL DATA: Fall.  Deformity.

EXAM:
LEFT HAND - COMPLETE 3+ VIEW

[hand pa]
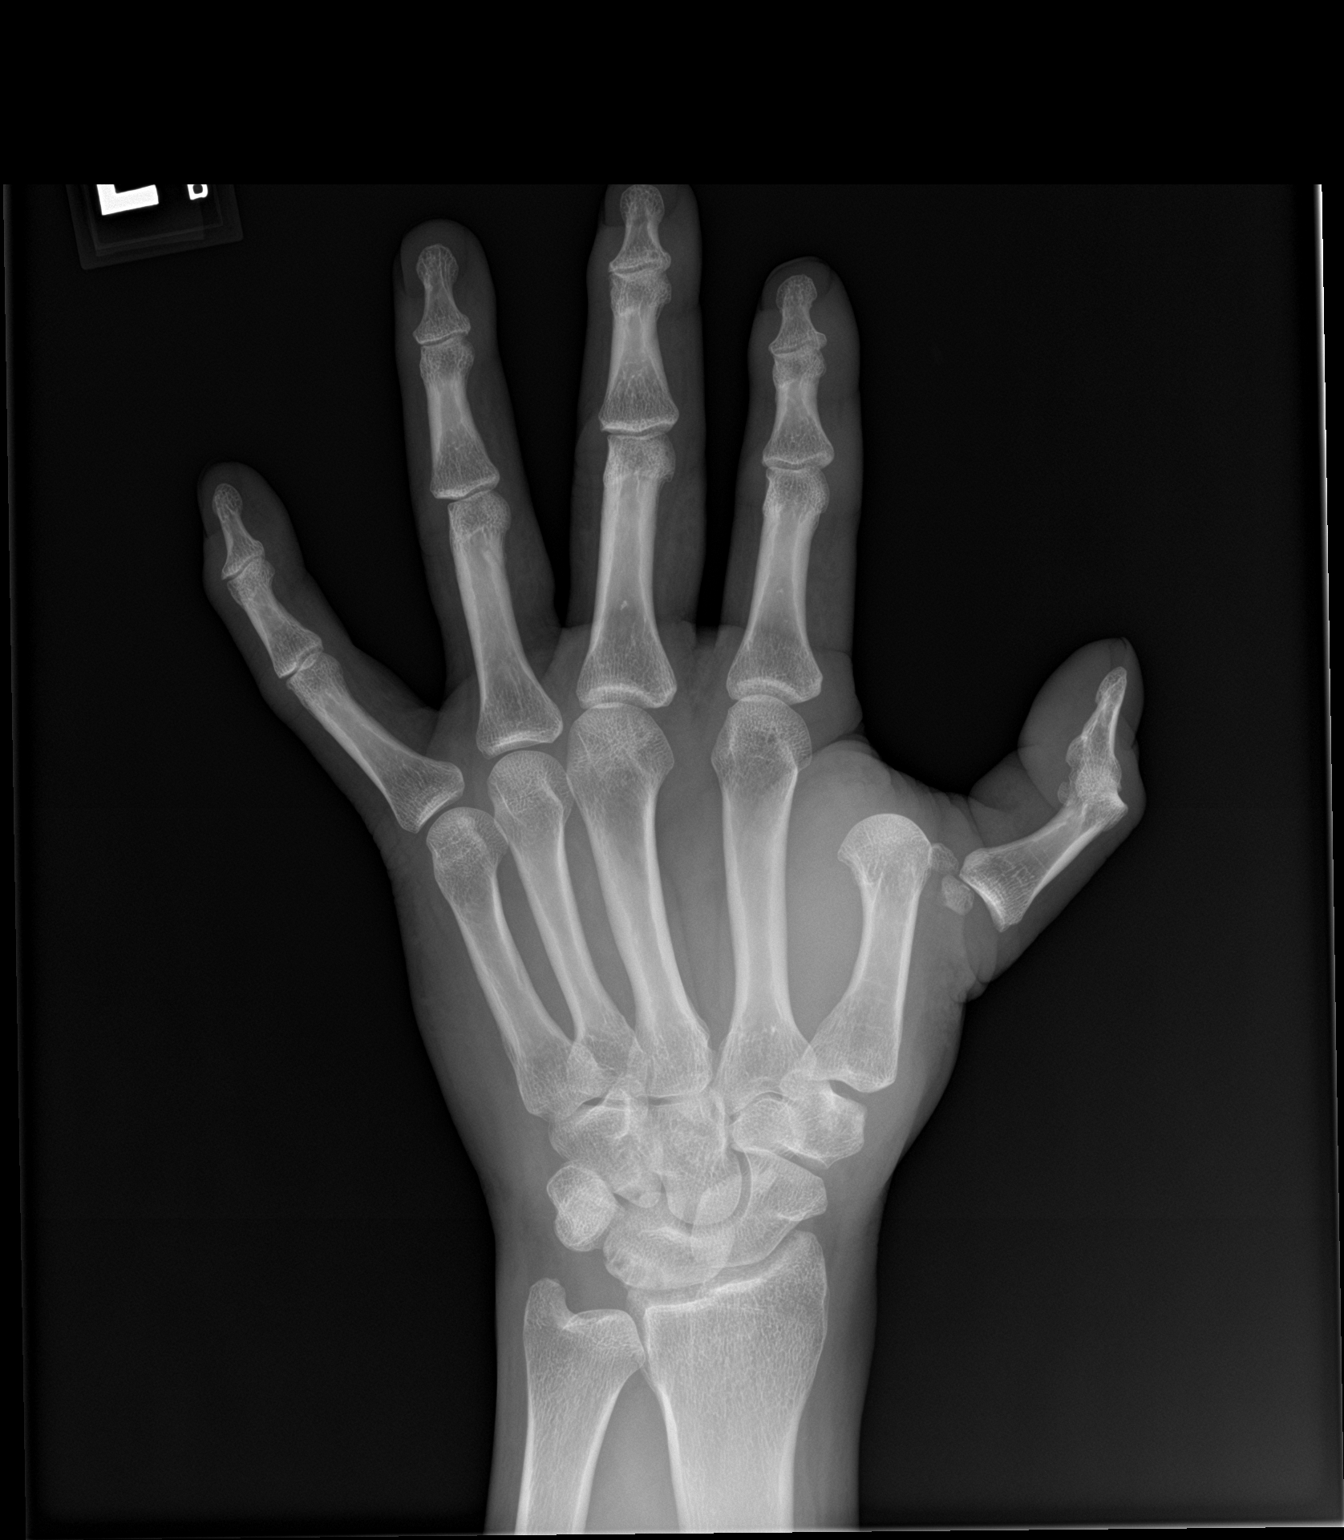

[hand obl]
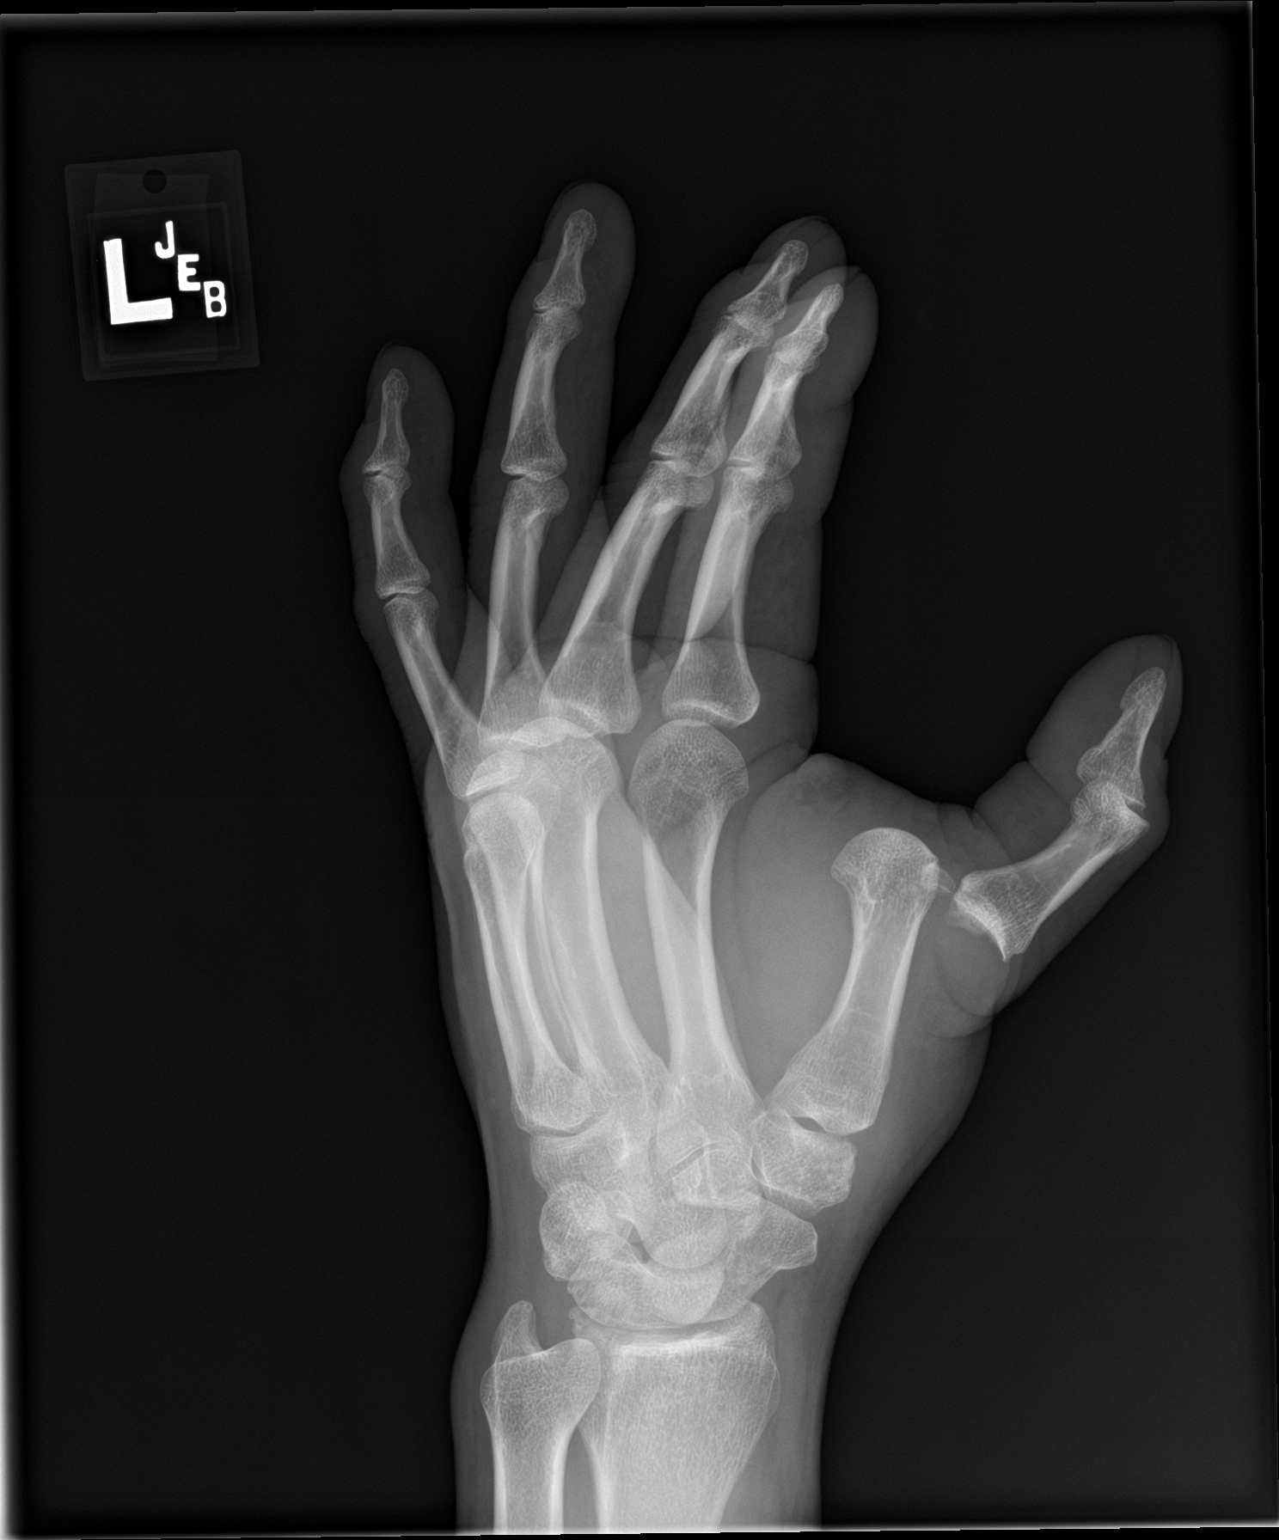

[hand lat]
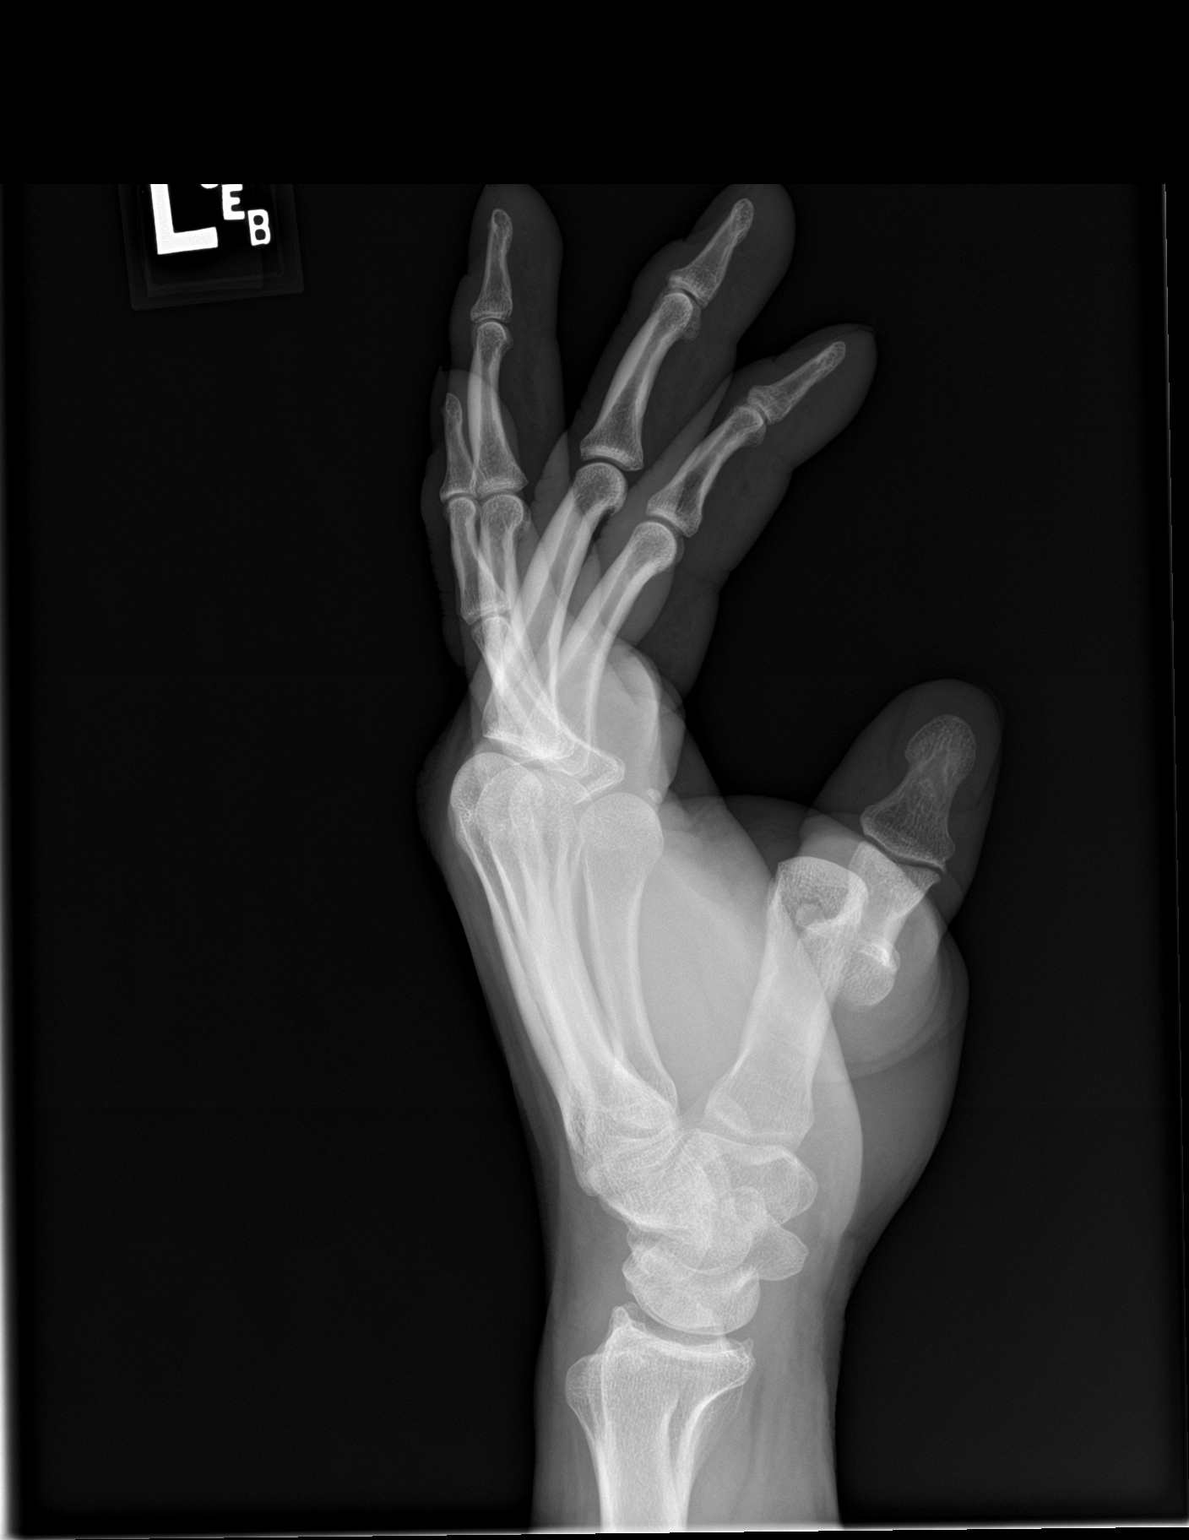

[3 of 3 positions shown; findings below may reference images not displayed]

FINDINGS: Dislocation of the proximal phalanx of the thumb relative to the
first metacarpal, radially and somewhat anteriorly. No complicating
fracture.
IMPRESSION: Dislocation of the first metacarpal phalangeal joint as detailed
above.
